# Patient Record
Sex: Female | Born: 1980 | Hispanic: No | Marital: Single | Smoking: Current every day smoker
Health system: Southern US, Community
[De-identification: ages and names within clinical notes are randomized; demographics above are authoritative.]

## PROBLEM LIST (undated history)

## (undated) DIAGNOSIS — R011 Cardiac murmur, unspecified: Secondary | ICD-10-CM

## (undated) DIAGNOSIS — F419 Anxiety disorder, unspecified: Secondary | ICD-10-CM

---
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
## 2004-08-14 ENCOUNTER — Emergency Department (HOSPITAL_COMMUNITY): Admission: EM | Admit: 2004-08-14 | Discharge: 2004-08-14 | Payer: Self-pay | Admitting: Emergency Medicine

## 2004-12-06 ENCOUNTER — Emergency Department (HOSPITAL_COMMUNITY): Admission: EM | Admit: 2004-12-06 | Discharge: 2004-12-06 | Payer: Self-pay | Admitting: *Deleted

## 2007-06-15 ENCOUNTER — Emergency Department (HOSPITAL_COMMUNITY): Admission: EM | Admit: 2007-06-15 | Discharge: 2007-06-15 | Payer: Self-pay | Admitting: Emergency Medicine

## 2007-09-16 ENCOUNTER — Emergency Department (HOSPITAL_COMMUNITY): Admission: EM | Admit: 2007-09-16 | Discharge: 2007-09-17 | Payer: Self-pay | Admitting: Emergency Medicine

## 2008-07-26 ENCOUNTER — Emergency Department (HOSPITAL_COMMUNITY): Admission: EM | Admit: 2008-07-26 | Discharge: 2008-07-26 | Payer: Self-pay | Admitting: Emergency Medicine

## 2008-10-06 ENCOUNTER — Emergency Department (HOSPITAL_COMMUNITY): Admission: EM | Admit: 2008-10-06 | Discharge: 2008-10-06 | Payer: Self-pay | Admitting: Emergency Medicine

## 2008-10-15 ENCOUNTER — Emergency Department (HOSPITAL_COMMUNITY): Admission: EM | Admit: 2008-10-15 | Discharge: 2008-10-15 | Payer: Self-pay | Admitting: Emergency Medicine

## 2008-11-25 ENCOUNTER — Inpatient Hospital Stay (HOSPITAL_COMMUNITY): Admission: AD | Admit: 2008-11-25 | Discharge: 2008-11-26 | Payer: Self-pay | Admitting: Obstetrics and Gynecology

## 2010-04-24 ENCOUNTER — Emergency Department (HOSPITAL_COMMUNITY)
Admission: EM | Admit: 2010-04-24 | Discharge: 2010-04-24 | Payer: Self-pay | Source: Home / Self Care | Admitting: Emergency Medicine

## 2010-07-02 LAB — WET PREP, GENITAL: Trich, Wet Prep: NONE SEEN

## 2010-07-02 LAB — URINALYSIS, ROUTINE W REFLEX MICROSCOPIC
Bilirubin Urine: NEGATIVE
Glucose, UA: NEGATIVE mg/dL
Ketones, ur: NEGATIVE mg/dL
Leukocytes, UA: NEGATIVE
Nitrite: NEGATIVE
Protein, ur: NEGATIVE mg/dL
Specific Gravity, Urine: 1.011 (ref 1.005–1.030)
Urobilinogen, UA: 0.2 mg/dL (ref 0.0–1.0)
pH: 5.5 (ref 5.0–8.0)

## 2010-07-02 LAB — GC/CHLAMYDIA PROBE AMP, GENITAL
Chlamydia, DNA Probe: NEGATIVE
GC Probe Amp, Genital: NEGATIVE

## 2010-07-02 LAB — URINE MICROSCOPIC-ADD ON

## 2010-07-02 LAB — POCT PREGNANCY, URINE: Preg Test, Ur: NEGATIVE

## 2010-07-24 ENCOUNTER — Emergency Department (HOSPITAL_COMMUNITY)
Admission: EM | Admit: 2010-07-24 | Discharge: 2010-07-24 | Disposition: A | Discharge status: Home / Self Care | Payer: Self-pay | Attending: Emergency Medicine | Admitting: Emergency Medicine

## 2010-07-24 DIAGNOSIS — K089 Disorder of teeth and supporting structures, unspecified: Secondary | ICD-10-CM | POA: Insufficient documentation

## 2010-07-24 DIAGNOSIS — K029 Dental caries, unspecified: Secondary | ICD-10-CM | POA: Insufficient documentation

## 2010-07-29 LAB — CBC
HCT: 41 % (ref 36.0–46.0)
Hemoglobin: 14.3 g/dL (ref 12.0–15.0)
MCHC: 34.9 g/dL (ref 30.0–36.0)
MCV: 84.7 fL (ref 78.0–100.0)
Platelets: 293 10*3/uL (ref 150–400)
RBC: 4.84 MIL/uL (ref 3.87–5.11)
RDW: 12.9 % (ref 11.5–15.5)
WBC: 11.6 10*3/uL — ABNORMAL HIGH (ref 4.0–10.5)

## 2010-07-29 LAB — URINALYSIS, ROUTINE W REFLEX MICROSCOPIC
Bilirubin Urine: NEGATIVE
Glucose, UA: NEGATIVE mg/dL
Ketones, ur: NEGATIVE mg/dL
Nitrite: NEGATIVE
Protein, ur: NEGATIVE mg/dL
Specific Gravity, Urine: 1.02 (ref 1.005–1.030)
Urobilinogen, UA: 0.2 mg/dL (ref 0.0–1.0)
pH: 6.5 (ref 5.0–8.0)

## 2010-07-29 LAB — URINE CULTURE: Colony Count: >=100000

## 2010-07-29 LAB — COMPREHENSIVE METABOLIC PANEL
ALT: 19 U/L (ref 0–35)
AST: 21 U/L (ref 0–37)
Albumin: 4 g/dL (ref 3.5–5.2)
Alkaline Phosphatase: 52 U/L (ref 39–117)
BUN: 8 mg/dL (ref 6–23)
CO2: 28 mEq/L (ref 19–32)
Calcium: 9.8 mg/dL (ref 8.4–10.5)
Chloride: 104 mEq/L (ref 96–112)
Creatinine, Ser: 0.63 mg/dL (ref 0.4–1.2)
GFR calc Af Amer: >60 mL/min (ref >60)
GFR calc non Af Amer: >60 mL/min (ref >60)
Glucose, Bld: 94 mg/dL (ref 70–99)
Potassium: 4.1 mEq/L (ref 3.5–5.1)
Sodium: 139 mEq/L (ref 135–145)
Total Bilirubin: 0.3 mg/dL (ref 0.3–1.2)
Total Protein: 7.8 g/dL (ref 6.0–8.3)

## 2010-07-29 LAB — POCT PREGNANCY, URINE: Preg Test, Ur: NEGATIVE

## 2010-07-29 LAB — URINE MICROSCOPIC-ADD ON

## 2010-07-29 LAB — WET PREP, GENITAL
Trich, Wet Prep: NONE SEEN
Yeast Wet Prep HPF POC: NONE SEEN

## 2010-07-29 LAB — GC/CHLAMYDIA PROBE AMP, GENITAL
Chlamydia, DNA Probe: NEGATIVE
GC Probe Amp, Genital: NEGATIVE

## 2010-10-05 ENCOUNTER — Emergency Department (HOSPITAL_COMMUNITY): Payer: Self-pay

## 2010-10-05 ENCOUNTER — Emergency Department (HOSPITAL_COMMUNITY)
Admission: EM | Admit: 2010-10-05 | Discharge: 2010-10-05 | Disposition: A | Discharge status: Home / Self Care | Payer: Self-pay | Attending: Emergency Medicine | Admitting: Emergency Medicine

## 2010-10-05 DIAGNOSIS — W010XXA Fall on same level from slipping, tripping and stumbling without subsequent striking against object, initial encounter: Secondary | ICD-10-CM | POA: Insufficient documentation

## 2010-10-05 DIAGNOSIS — M25569 Pain in unspecified knee: Secondary | ICD-10-CM | POA: Insufficient documentation

## 2011-01-11 LAB — URINE MICROSCOPIC-ADD ON

## 2011-01-11 LAB — URINALYSIS, ROUTINE W REFLEX MICROSCOPIC
Bilirubin Urine: NEGATIVE
Glucose, UA: NEGATIVE
Ketones, ur: NEGATIVE
Nitrite: NEGATIVE
Protein, ur: NEGATIVE
Specific Gravity, Urine: 1.013
Urobilinogen, UA: 0.2
pH: 5.5

## 2011-01-11 LAB — PREGNANCY, URINE: Preg Test, Ur: NEGATIVE

## 2011-01-16 LAB — POCT PREGNANCY, URINE
Operator id: 22937
Preg Test, Ur: NEGATIVE

## 2011-06-22 ENCOUNTER — Emergency Department (HOSPITAL_COMMUNITY): Payer: Self-pay

## 2011-06-22 ENCOUNTER — Encounter (HOSPITAL_COMMUNITY): Payer: Self-pay | Admitting: *Deleted

## 2011-06-22 ENCOUNTER — Emergency Department (HOSPITAL_COMMUNITY)
Admission: EM | Admit: 2011-06-22 | Discharge: 2011-06-22 | Disposition: A | Discharge status: Home / Self Care | Payer: Self-pay | Attending: Emergency Medicine | Admitting: Emergency Medicine

## 2011-06-22 DIAGNOSIS — R10819 Abdominal tenderness, unspecified site: Secondary | ICD-10-CM | POA: Insufficient documentation

## 2011-06-22 DIAGNOSIS — N760 Acute vaginitis: Secondary | ICD-10-CM | POA: Insufficient documentation

## 2011-06-22 DIAGNOSIS — E010 Iodine-deficiency related diffuse (endemic) goiter: Secondary | ICD-10-CM

## 2011-06-22 DIAGNOSIS — R112 Nausea with vomiting, unspecified: Secondary | ICD-10-CM | POA: Insufficient documentation

## 2011-06-22 DIAGNOSIS — A499 Bacterial infection, unspecified: Secondary | ICD-10-CM | POA: Insufficient documentation

## 2011-06-22 DIAGNOSIS — F17200 Nicotine dependence, unspecified, uncomplicated: Secondary | ICD-10-CM | POA: Insufficient documentation

## 2011-06-22 DIAGNOSIS — R070 Pain in throat: Secondary | ICD-10-CM | POA: Insufficient documentation

## 2011-06-22 DIAGNOSIS — R300 Dysuria: Secondary | ICD-10-CM | POA: Insufficient documentation

## 2011-06-22 DIAGNOSIS — B9689 Other specified bacterial agents as the cause of diseases classified elsewhere: Secondary | ICD-10-CM

## 2011-06-22 DIAGNOSIS — R102 Pelvic and perineal pain: Secondary | ICD-10-CM

## 2011-06-22 DIAGNOSIS — E049 Nontoxic goiter, unspecified: Secondary | ICD-10-CM | POA: Insufficient documentation

## 2011-06-22 DIAGNOSIS — M542 Cervicalgia: Secondary | ICD-10-CM | POA: Insufficient documentation

## 2011-06-22 DIAGNOSIS — R059 Cough, unspecified: Secondary | ICD-10-CM

## 2011-06-22 DIAGNOSIS — R109 Unspecified abdominal pain: Secondary | ICD-10-CM | POA: Insufficient documentation

## 2011-06-22 DIAGNOSIS — N949 Unspecified condition associated with female genital organs and menstrual cycle: Secondary | ICD-10-CM | POA: Insufficient documentation

## 2011-06-22 DIAGNOSIS — M549 Dorsalgia, unspecified: Secondary | ICD-10-CM | POA: Insufficient documentation

## 2011-06-22 DIAGNOSIS — R05 Cough: Secondary | ICD-10-CM

## 2011-06-22 LAB — WET PREP, GENITAL
Trich, Wet Prep: NONE SEEN
Yeast Wet Prep HPF POC: NONE SEEN

## 2011-06-22 LAB — URINALYSIS, ROUTINE W REFLEX MICROSCOPIC
Bilirubin Urine: NEGATIVE
Glucose, UA: NEGATIVE mg/dL
Ketones, ur: NEGATIVE mg/dL
Leukocytes, UA: NEGATIVE
Nitrite: NEGATIVE
Protein, ur: NEGATIVE mg/dL
Specific Gravity, Urine: 1.015 (ref 1.005–1.030)
Urobilinogen, UA: 0.2 mg/dL (ref 0.0–1.0)
pH: 6 (ref 5.0–8.0)

## 2011-06-22 LAB — URINE MICROSCOPIC-ADD ON

## 2011-06-22 LAB — PREGNANCY, URINE: Preg Test, Ur: NEGATIVE

## 2011-06-22 MED ORDER — HYDROMORPHONE HCL PF 1 MG/ML IJ SOLN
1.0000 mg | Freq: Once | INTRAMUSCULAR | Status: AC
  Administered 2011-06-22: 1 mg via INTRAMUSCULAR
  Filled 2011-06-22: qty 1

## 2011-06-22 MED ORDER — PROMETHAZINE HCL 25 MG PO TABS: 25.0000 mg | ORAL_TABLET | Freq: Four times a day (QID) | ORAL | Status: DC | PRN

## 2011-06-22 MED ORDER — HYDROCODONE-ACETAMINOPHEN 5-325 MG PO TABS
2.0000 | ORAL_TABLET | Freq: Once | ORAL | Status: AC
  Administered 2011-06-22: 2 via ORAL
  Filled 2011-06-22: qty 2

## 2011-06-22 MED ORDER — ONDANSETRON 8 MG PO TBDP
8.0000 mg | ORAL_TABLET | Freq: Once | ORAL | Status: AC
  Administered 2011-06-22: 8 mg via ORAL
  Filled 2011-06-22: qty 1

## 2011-06-22 MED ORDER — HYDROCODONE-ACETAMINOPHEN 5-325 MG PO TABS: ORAL_TABLET | ORAL | Status: DC

## 2011-06-22 MED ORDER — GI COCKTAIL ~~LOC~~
30.0000 mL | Freq: Once | ORAL | Status: AC
  Administered 2011-06-22: 30 mL via ORAL
  Filled 2011-06-22: qty 30

## 2011-06-22 MED ORDER — METRONIDAZOLE 500 MG PO TABS: 500.0000 mg | ORAL_TABLET | Freq: Two times a day (BID) | ORAL | Status: AC

## 2011-06-22 NOTE — Discharge Instructions (Signed)
Bacterial Vaginosis Bacterial vaginosis (BV) is a vaginal infection where the normal balance of bacteria in the vagina is disrupted. The normal balance is then replaced by an overgrowth of certain bacteria. There are several different kinds of bacteria that can cause BV. BV is the most common vaginal infection in women of childbearing age. CAUSES   The cause of BV is not fully understood. BV develops when there is an increase or imbalance of harmful bacteria.   Some activities or behaviors can upset the normal balance of bacteria in the vagina and put women at increased risk including:   Having a new sex partner or multiple sex partners.   Douching.   Using an intrauterine device (IUD) for contraception.   It is not clear what role sexual activity plays in the development of BV. However, women that have never had sexual intercourse are rarely infected with BV.  Women do not get BV from toilet seats, bedding, swimming pools or from touching objects around them.  SYMPTOMS   Grey vaginal discharge.   A fish-like odor with discharge, especially after sexual intercourse.   Itching or burning of the vagina and vulva.   Burning or pain with urination.   Some women have no signs or symptoms at all.  DIAGNOSIS  Your caregiver must examine the vagina for signs of BV. Your caregiver will perform lab tests and look at the sample of vaginal fluid through a microscope. They will look for bacteria and abnormal cells (clue cells), a pH test higher than 4.5, and a positive amine test all associated with BV.  RISKS AND COMPLICATIONS   Pelvic inflammatory disease (PID).   Infections following gynecology surgery.   Developing HIV.   Developing herpes virus.  TREATMENT  Sometimes BV will clear up without treatment. However, all women with symptoms of BV should be treated to avoid complications, especially if gynecology surgery is planned. Female partners generally do not need to be treated. However,  BV may spread between female sex partners so treatment is helpful in preventing a recurrence of BV.   BV may be treated with antibiotics. The antibiotics come in either pill or vaginal cream forms. Either can be used with nonpregnant or pregnant women, but the recommended dosages differ. These antibiotics are not harmful to the baby.   BV can recur after treatment. If this happens, a second round of antibiotics will often be prescribed.   Treatment is important for pregnant women. If not treated, BV can cause a premature delivery, especially for a pregnant woman who had a premature birth in the past. All pregnant women who have symptoms of BV should be checked and treated.   For chronic reoccurrence of BV, treatment with a type of prescribed gel vaginally twice a week is helpful.  HOME CARE INSTRUCTIONS   Finish all medication as directed by your caregiver.   Do not have sex until treatment is completed.   Tell your sexual partner that you have a vaginal infection. They should see their caregiver and be treated if they have problems, such as a mild rash or itching.   Practice safe sex. Use condoms. Only have 1 sex partner.  PREVENTION  Basic prevention steps can help reduce the risk of upsetting the natural balance of bacteria in the vagina and developing BV:  Do not have sexual intercourse (be abstinent).   Do not douche.   Use all of the medicine prescribed for treatment of BV, even if the signs and symptoms go away.     Tell your sex partner if you have BV. That way, they can be treated, if needed, to prevent reoccurrence.  SEEK MEDICAL CARE IF:   Your symptoms are not improving after 3 days of treatment.   You have increased discharge, pain, or fever.  MAKE SURE YOU:   Understand these instructions.   Will watch your condition.   Will get help right away if you are not doing well or get worse.  FOR MORE INFORMATION  Division of STD Prevention (DSTDP), Centers for Disease  Control and Prevention: [employer]Apps[employer] American Social Health Association (ASHA): www.ashastd.org  Document Released: 04/08/2005 Document Revised: 12/19/2010 Document Reviewed: 09/29/2008 [facility] Patient Information 2012 [place], [place].    Nausea and Vomiting Nausea is a sick feeling that often comes before throwing up (vomiting). Vomiting is a reflex where stomach contents come out of your mouth. Vomiting can cause severe loss of body fluids (dehydration). Children and elderly adults can become dehydrated quickly, especially if they also have diarrhea. Nausea and vomiting are symptoms of a condition or disease. It is important to find the cause of your symptoms. CAUSES   Direct irritation of the stomach lining. This irritation can result from increased acid production (gastroesophageal reflux disease), infection, food poisoning, taking certain medicines (such as nonsteroidal anti-inflammatory drugs), alcohol use, or tobacco use.   Signals from the brain.These signals could be caused by a headache, heat exposure, an inner ear disturbance, increased pressure in the brain from injury, infection, a tumor, or a concussion, pain, emotional stimulus, or metabolic problems.   An obstruction in the gastrointestinal tract (bowel obstruction).   Illnesses such as diabetes, hepatitis, gallbladder problems, appendicitis, kidney problems, cancer, sepsis, atypical symptoms of a heart attack, or eating disorders.   Medical treatments such as chemotherapy and radiation.   Receiving medicine that makes you sleep (general anesthetic) during surgery.  DIAGNOSIS Your caregiver may ask for tests to be done if the problems do not improve after a few days. Tests may also be done if symptoms are severe or if the reason for the nausea and vomiting is not clear. Tests may include:  Urine tests.   Blood tests.   Stool tests.   Cultures (to look for evidence of infection).   X-rays or other imaging studies.   Test results can help your caregiver make decisions about treatment or the need for additional tests. TREATMENT You need to stay well hydrated. Drink frequently but in small amounts.You may wish to drink water, sports drinks, clear broth, or eat frozen ice pops or gelatin dessert to help stay hydrated.When you eat, eating slowly may help prevent nausea.There are also some antinausea medicines that may help prevent nausea. HOME CARE INSTRUCTIONS   Take all medicine as directed by your caregiver.   If you do not have an appetite, do not force yourself to eat. However, you must continue to drink fluids.   If you have an appetite, eat a normal diet unless your caregiver tells you differently.   Eat a variety of complex carbohydrates (rice, wheat, potatoes, bread), lean meats, yogurt, fruits, and vegetables.   Avoid high-fat foods because they are more difficult to digest.   Drink enough water and fluids to keep your urine clear or pale yellow.   If you are dehydrated, ask your caregiver for specific rehydration instructions. Signs of dehydration may include:   Severe thirst.   Dry lips and mouth.   Dizziness.   Dark urine.   Decreasing urine frequency and amount.  Confusion.   Rapid breathing or pulse.  SEEK IMMEDIATE MEDICAL CARE IF:   You have blood or brown flecks (like coffee grounds) in your vomit.   You have black or bloody stools.   You have a severe headache or stiff neck.   You are confused.   You have severe abdominal pain.   You have chest pain or trouble breathing.   You do not urinate at least once every 8 hours.   You develop cold or clammy skin.   You continue to vomit for longer than 24 to 48 hours.   You have a fever.  MAKE SURE YOU:   Understand these instructions.   Will watch your condition.   Will get help right away if you are not doing well or get worse.  Document Released: 04/08/2005 Document Revised: 12/19/2010 Document Reviewed:  09/05/2010 [facility] Patient Information 2012 [place], [place].     I recommend that you start seeing a primary care provider such as [facility] or county health department.  I think you may benefit from ultrasound of your thyroid and also routine PAP smears each year.  Norco is a narcotic which can cause addiction, drowsiness or slowed breathing so use with caution.  Do not drive while taking it.  Do not drink alcohol while taking metronidazole (Flagyl) antibiotic.

## 2011-06-22 NOTE — ED Provider Notes (Signed)
History     CSN: [id number]  Arrival date & time 06/22/11  1649   First MD Initiated Contact with Patient 06/22/11 1834      Chief Complaint  Patient presents with  . Abdominal Pain    (Consider location/radiation/quality/duration/timing/severity/associated sxs/prior treatment) HPI Comments: Patient reports onset of lower abdominal discomfort about 2 weeks ago. She reports some associated dysuria and radiation to her back more so on the right side. She reports the lower abdominal pain is somewhat worse in the right side but certainly feels of the left side and also in the suprapubic region. She notes a thick white discharge that has a foul odor. She reports she is sexually active, does not use condoms or birth control. Her last menstrual cycle was about 1-1/2 weeks ago and was normal. She denies any history of STD exposure, history of STDs or vaginal infections in the past. She has had urinary tract infection the past. She reports in the last several days she has had nausea and vomiting primarily when she tries to use her drink. She subsequently now also has a dry cough as well as pain in her upper epigastric region which she agrees may be attributed to vomiting. She denies fever or chills. She also notes in the last 5 days she's had some pain in her throat with swallowing and has noticed some lumps that she can palpate on the left side of her neck which are mildly tender. She denies a stiff neck, rash. She denies shortness of breath or wheezing. She reports that she does smoke cigarettes. She reports that she does not have insurance and does not have a primary care physician is why she has not followed up with anyone until today. She denies any diarrhea, constipation or black or bloody stools. She denies weight loss, night sweats, palpitations, loss of hair.    Patient is a 32 y.o. female presenting with abdominal pain. The history is provided by the patient and a relative.  Abdominal Pain The  primary symptoms of the illness include abdominal pain, nausea, vomiting and vaginal discharge. The primary symptoms of the illness do not include fever, shortness of breath or vaginal bleeding.  Additional symptoms associated with the illness include back pain. Symptoms associated with the illness do not include chills.    History reviewed. No pertinent past medical history.  History reviewed. No pertinent past surgical history.  History reviewed. No pertinent family history.  History  Substance Use Topics  . Smoking status: Current Everyday Smoker  . Smokeless tobacco: Not on file  . Alcohol Use: No    OB History    Grav Para Term Preterm Abortions TAB SAB Ect Mult Living                  Review of Systems  Constitutional: Positive for appetite change. Negative for fever and chills.  HENT: Negative for congestion, rhinorrhea, voice change and sinus pressure.   Respiratory: Positive for cough. Negative for shortness of breath.   Gastrointestinal: Positive for nausea, vomiting and abdominal pain.  Genitourinary: Positive for flank pain and vaginal discharge. Negative for vaginal bleeding.  Musculoskeletal: Positive for back pain.  Skin: Negative for rash and wound.  Hematological: Positive for adenopathy.  All other systems reviewed and are negative.    Allergies  Ibuprofen; Penicillins; and Tramadol  Home Medications  No current outpatient prescriptions on file.  BP 100/64  Pulse 76  Temp(Src) 97.4 F (36.3 C) (Oral)  Resp 20  Ht  5\' 2"  (1.575 m)  Wt 160 lb (72.576 kg)  BMI 29.26 kg/m2  SpO2 95%  LMP 06/13/2011  Physical Exam  Nursing note and vitals reviewed. Constitutional: She is oriented to person, place, and time. Vital signs are normal. She appears well-developed and well-nourished. No distress.       hirsutism  HENT:  Head: Normocephalic and atraumatic.  Eyes: Conjunctivae and EOM are normal. Pupils are equal, round, and reactive to light. No scleral  icterus.  Neck: Trachea normal and phonation normal. Neck supple. No JVD present. No tracheal deviation present. Thyromegaly present.  Cardiovascular: Normal rate.   Pulmonary/Chest: Effort normal. No accessory muscle usage or stridor. Not tachypneic. No respiratory distress. She has no decreased breath sounds. She has no wheezes. She has no rhonchi.  Abdominal: Soft. She exhibits no distension and no mass. There is tenderness. There is guarding. There is no rebound.  Genitourinary: Cervix exhibits no motion tenderness and no discharge. Right adnexum displays tenderness. Right adnexum displays no mass. Left adnexum displays no mass. No erythema or bleeding around the vagina. Vaginal discharge found.       Chaperone present  Musculoskeletal: She exhibits no edema.  Neurological: She is alert and oriented to person, place, and time.  Skin: Skin is warm, dry and intact. No rash noted. She is not diaphoretic.  Psychiatric: She has a normal mood and affect.    ED Course  Procedures (including critical care time)  Labs Reviewed  URINALYSIS, ROUTINE W REFLEX MICROSCOPIC - Abnormal; Notable for the following:    Hgb urine dipstick SMALL (*)    All other components within normal limits  URINE MICROSCOPIC-ADD ON - Abnormal; Notable for the following:    Squamous Epithelial / LPF MANY (*)    Bacteria, UA MANY (*)    All other components within normal limits  WET PREP, GENITAL - Abnormal; Notable for the following:    Clue Cells Wet Prep HPF POC MANY (*)    WBC, Wet Prep HPF POC FEW (*)    All other components within normal limits  PREGNANCY, URINE  GC/CHLAMYDIA PROBE AMP, GENITAL   Dg Chest 2 View  06/22/2011  *RADIOLOGY REPORT*  Clinical Data: Abdominal pain and cough  CHEST - 2 VIEW  Comparison: None  Findings: The heart size and mediastinal contours are within normal limits.  Both lungs are clear.  The visualized skeletal structures are unremarkable.  IMPRESSION: No active cardiopulmonary  abnormalities.  Original Report Authenticated By: Rosealee Albee, M.D.   I reviewed above CXR myself  No diagnosis found.    MDM   Patient has normal vital signs and does not appear to be significantly in any distress. She is nontoxic in appearance. Her urinalysis does not reveal any evidence of urinary tract infection except for the many bacteria, however she also has many squamous epithelial which suggests a unclean sample. However there is no leukocyte esterase or nitrite positivity. Plan is to perform a pelvic examination to assess for possible vaginal or uterine infection. Given she has tenderness on both sides of her abdomen, do not fever biliary attack nor appendicitis. Her abdomen is otherwise soft and although she has some mild guarding, she does not have distention or rebound tenderness. The small lumps on the left side of her neck is in the area of the left side of her thyroid. Patient is counseled to obtain a primary care physician for possible ultrasound of her thyroid as an outpatient. Her review of systems does  not reveal any symptoms that seem to be too concerning for hypo-or hyperthyroidism. She reports that she's had normal menses, no weight changes no palpitations.  IM analgesics and antiemetics are provided.        Pt was signed out to Dr. Estell Harpin for follow up of U/S to r/o torsion or abscess.  Gavin Pound. Oletta Lamas, MD 06/23/11 2141

## 2011-06-22 NOTE — ED Notes (Signed)
Pt states abdominal pain x 2 weeks, states white vaginal discharge with foul odor. Also states lower back pain. Also "knots to her neck" and burning sensation to neck.

## 2011-06-23 NOTE — ED Provider Notes (Signed)
Results for orders placed during the hospital encounter of 06/22/11  URINALYSIS, ROUTINE W REFLEX MICROSCOPIC      Component Value Range   Color, Urine YELLOW  YELLOW    APPearance CLEAR  CLEAR    Specific Gravity, Urine 1.015  1.005 - 1.030    pH 6.0  5.0 - 8.0    Glucose, UA NEGATIVE  NEGATIVE (mg/dL)   Hgb urine dipstick SMALL (*) NEGATIVE    Bilirubin Urine NEGATIVE  NEGATIVE    Ketones, ur NEGATIVE  NEGATIVE (mg/dL)   Protein, ur NEGATIVE  NEGATIVE (mg/dL)   Urobilinogen, UA 0.2  0.0 - 1.0 (mg/dL)   Nitrite NEGATIVE  NEGATIVE    Leukocytes, UA NEGATIVE  NEGATIVE   PREGNANCY, URINE      Component Value Range   Preg Test, Ur NEGATIVE  NEGATIVE   URINE MICROSCOPIC-ADD ON      Component Value Range   Squamous Epithelial / LPF MANY (*) RARE    WBC, UA 7-10  <3 (WBC/hpf)   RBC / HPF 3-6  <3 (RBC/hpf)   Bacteria, UA MANY (*) RARE   WET PREP, GENITAL      Component Value Range   Yeast Wet Prep HPF POC NONE SEEN  NONE SEEN    Trich, Wet Prep NONE SEEN  NONE SEEN    Clue Cells Wet Prep HPF POC MANY (*) NONE SEEN    WBC, Wet Prep HPF POC FEW (*) NONE SEEN    Dg Chest 2 View  06/22/2011  *RADIOLOGY REPORT*  Clinical Data: Abdominal pain and cough  CHEST - 2 VIEW  Comparison: None  Findings: The heart size and mediastinal contours are within normal limits.  Both lungs are clear.  The visualized skeletal structures are unremarkable.  IMPRESSION: No active cardiopulmonary abnormalities.  Original Report Authenticated By: Rosealee Albee, M.D.   US Transvaginal Non-ob  06/22/2011  *RADIOLOGY REPORT*  Clinical Data:  Right-sided pelvic pain and vomiting; assess for ovarian torsion.  TRANSABDOMINAL AND TRANSVAGINAL ULTRASOUND OF PELVIS DOPPLER ULTRASOUND OF OVARIES  Technique:  Both transabdominal and transvaginal ultrasound examinations of the pelvis were performed. Transabdominal technique was performed for global imaging of the pelvis including uterus, ovaries, adnexal regions, and pelvic  cul-de-sac.  It was necessary to proceed with endovaginal exam following the transabdominal exam to visualize the uterus and ovaries in greater detail.  Color and duplex Doppler ultrasound was utilized to evaluate blood flow to the ovaries.  Comparison:  Pelvic ultrasound performed 11/25/2008  Findings:  Uterus:  Normal in size and appearance; measures 6.6 x 2.9 x 4.2 cm.  Endometrium:  Normal in thickness and appearance; measures 0.4 cm in thickness.  Right ovary: Normal appearance/no adnexal mass; measures 4.0 x 2.1 x 2.3 cm.  Left ovary:   Normal appearance/no adnexal mass; measures 3.1 x 2.3 x 2.3 cm.  Pulsed Doppler evaluation demonstrates normal low-resistance arterial and venous waveforms in both ovaries.  IMPRESSION: Normal exam.  No evidence of pelvic mass or other significant abnormality.  No sonographic evidence for ovarian torsion.  Original Report Authenticated By: Tonia Ghent, M.D.   US Pelvis Complete  06/22/2011  *RADIOLOGY REPORT*  Clinical Data:  Right-sided pelvic pain and vomiting; assess for ovarian torsion.  TRANSABDOMINAL AND TRANSVAGINAL ULTRASOUND OF PELVIS DOPPLER ULTRASOUND OF OVARIES  Technique:  Both transabdominal and transvaginal ultrasound examinations of the pelvis were performed. Transabdominal technique was performed for global imaging of the pelvis including uterus, ovaries, adnexal regions, and pelvic cul-de-sac.  It  was necessary to proceed with endovaginal exam following the transabdominal exam to visualize the uterus and ovaries in greater detail.  Color and duplex Doppler ultrasound was utilized to evaluate blood flow to the ovaries.  Comparison:  Pelvic ultrasound performed 11/25/2008  Findings:  Uterus:  Normal in size and appearance; measures 6.6 x 2.9 x 4.2 cm.  Endometrium:  Normal in thickness and appearance; measures 0.4 cm in thickness.  Right ovary: Normal appearance/no adnexal mass; measures 4.0 x 2.1 x 2.3 cm.  Left ovary:   Normal appearance/no adnexal mass;  measures 3.1 x 2.3 x 2.3 cm.  Pulsed Doppler evaluation demonstrates normal low-resistance arterial and venous waveforms in both ovaries.  IMPRESSION: Normal exam.  No evidence of pelvic mass or other significant abnormality.  No sonographic evidence for ovarian torsion.  Original Report Authenticated By: Tonia Ghent, M.D.   [place] Art/ven Flow Abd Pelv Doppler  06/22/2011  *RADIOLOGY REPORT*  Clinical Data:  Right-sided pelvic pain and vomiting; assess for ovarian torsion.  TRANSABDOMINAL AND TRANSVAGINAL ULTRASOUND OF PELVIS DOPPLER ULTRASOUND OF OVARIES  Technique:  Both transabdominal and transvaginal ultrasound examinations of the pelvis were performed. Transabdominal technique was performed for global imaging of the pelvis including uterus, ovaries, adnexal regions, and pelvic cul-de-sac.  It was necessary to proceed with endovaginal exam following the transabdominal exam to visualize the uterus and ovaries in greater detail.  Color and duplex Doppler ultrasound was utilized to evaluate blood flow to the ovaries.  Comparison:  Pelvic ultrasound performed 11/25/2008  Findings:  Uterus:  Normal in size and appearance; measures 6.6 x 2.9 x 4.2 cm.  Endometrium:  Normal in thickness and appearance; measures 0.4 cm in thickness.  Right ovary: Normal appearance/no adnexal mass; measures 4.0 x 2.1 x 2.3 cm.  Left ovary:   Normal appearance/no adnexal mass; measures 3.1 x 2.3 x 2.3 cm.  Pulsed Doppler evaluation demonstrates normal low-resistance arterial and venous waveforms in both ovaries.  IMPRESSION: Normal exam.  No evidence of pelvic mass or other significant abnormality.  No sonographic evidence for ovarian torsion.  Original Report Authenticated By: Tonia Ghent, M.D.   Pt told of [place] results and discharged home   Benny Lennert, MD 06/23/11 1149

## 2011-06-24 LAB — GC/CHLAMYDIA PROBE AMP, GENITAL
Chlamydia, DNA Probe: NEGATIVE
GC Probe Amp, Genital: NEGATIVE

## 2011-07-22 ENCOUNTER — Encounter (HOSPITAL_COMMUNITY): Payer: Self-pay | Admitting: Emergency Medicine

## 2011-07-22 ENCOUNTER — Emergency Department (HOSPITAL_COMMUNITY)
Admission: EM | Admit: 2011-07-22 | Discharge: 2011-07-22 | Disposition: A | Discharge status: Home / Self Care | Payer: Self-pay | Attending: Emergency Medicine | Admitting: Emergency Medicine

## 2011-07-22 DIAGNOSIS — M549 Dorsalgia, unspecified: Secondary | ICD-10-CM

## 2011-07-22 DIAGNOSIS — R262 Difficulty in walking, not elsewhere classified: Secondary | ICD-10-CM | POA: Insufficient documentation

## 2011-07-22 MED ORDER — DIAZEPAM 5 MG PO TABS: 5.0000 mg | ORAL_TABLET | Freq: Two times a day (BID) | ORAL | Status: AC

## 2011-07-22 MED ORDER — OXYCODONE-ACETAMINOPHEN 5-325 MG PO TABS
2.0000 | ORAL_TABLET | Freq: Once | ORAL | Status: AC
  Administered 2011-07-22: 2 via ORAL
  Filled 2011-07-22: qty 2

## 2011-07-22 MED ORDER — HYDROCODONE-ACETAMINOPHEN 5-325 MG PO TABS: 2.0000 | ORAL_TABLET | ORAL | Status: AC | PRN

## 2011-07-22 MED ORDER — DIAZEPAM 5 MG/ML IJ SOLN
10.0000 mg | Freq: Once | INTRAMUSCULAR | Status: AC
  Administered 2011-07-22: 10 mg via INTRAVENOUS
  Filled 2011-07-22: qty 2

## 2011-07-22 NOTE — ED Notes (Signed)
Pt states two days ago she was carrying a bag of copper that weighed about 60 lbs since then she has had pain in her lower back  Pt states it does not matter what position she is in it hurts   Pt states pain radiates down her right leg

## 2011-07-22 NOTE — ED Provider Notes (Signed)
History     CSN: [id number]  Arrival date & time 07/22/11  1831   First MD Initiated Contact with Patient 07/22/11 2128      Chief Complaint  Patient presents with  . Back Pain    (Consider location/radiation/quality/duration/timing/severity/associated sxs/prior treatment) HPI Comments: Patient reports that two days ago she was lifting a large bag of copper and has been having lower back pain since that time.  Pain worse on the right side.  She has not taken anything for the pain.  No prior injury.   Patient is a 31 y.o. female presenting with back pain. The history is provided by the patient.  Back Pain  This is a new problem. The current episode started 2 days ago. The problem occurs constantly. The problem has not changed since onset.The pain is associated with lifting heavy objects. The pain is present in the lumbar spine. The quality of the pain is described as shooting. The pain radiates to the right thigh. The pain is moderate. The symptoms are aggravated by bending and twisting. Pertinent negatives include no fever, no numbness, no bowel incontinence, no perianal numbness, no dysuria, no paresthesias, no paresis, no tingling and no weakness. She has tried nothing for the symptoms.    History reviewed. No pertinent past medical history.  History reviewed. No pertinent past surgical history.  Family History  Problem Relation Age of Onset  . Hypertension Mother   . Heart failure Mother   . Heart failure Father     History  Substance Use Topics  . Smoking status: Current Everyday Smoker -- 0.5 packs/day    Types: Cigarettes  . Smokeless tobacco: Not on file  . Alcohol Use: No    OB History    Grav Para Term Preterm Abortions TAB SAB Ect Mult Living                  Review of Systems  Constitutional: Negative for fever.  Gastrointestinal: Negative for bowel incontinence.  Genitourinary: Negative for dysuria.  Musculoskeletal: Positive for back pain.    Neurological: Negative for tingling, weakness, numbness and paresthesias.    Allergies  Aspirin; Ibuprofen; Penicillins; and Tramadol  Home Medications   Current Outpatient Rx  Name Route Sig Dispense Refill  . HYDROCODONE-ACETAMINOPHEN 5-325 MG PO TABS  1-2 tablets po q 6 hours prn moderate to severe pain 20 tablet 0  . PROMETHAZINE HCL 25 MG PO TABS Oral Take 1 tablet (25 mg total) by mouth every 6 (six) hours as needed for nausea. 15 tablet 0    BP 131/71  Pulse 93  Temp(Src) 98 F (36.7 C) (Oral)  Resp 18  Wt 160 lb (72.576 kg)  SpO2 100%  LMP 07/14/2011  Physical Exam  Nursing note and vitals reviewed. Constitutional: She is oriented to person, place, and time. She appears well-developed and well-nourished. No distress.  HENT:  Head: Normocephalic and atraumatic.  Eyes: Conjunctivae and EOM are normal. No scleral icterus.  Neck: Normal range of motion and full passive range of motion without pain. Neck supple. No spinous process tenderness and no muscular tenderness present. No rigidity. Normal range of motion present. No Brudzinski's sign noted.  Cardiovascular: Normal rate, regular rhythm and intact distal pulses.   Pulmonary/Chest: Effort normal and breath sounds normal. No respiratory distress. She has no wheezes. She has no rales. She exhibits no tenderness.  Musculoskeletal:       Cervical back: She exhibits normal range of motion, no tenderness, no bony tenderness  and no pain.       Thoracic back: She exhibits no tenderness, no bony tenderness and no pain.       Lumbar back: She exhibits tenderness, bony tenderness and pain. She exhibits no spasm and normal pulse.       Bilateral lower extremities nontender without color change, baseline range of motion of extremities.  Pt has increased pain w ROM of lumbar spine. Pain w ambulation, no sign of ataxia.  Neurological: She is alert and oriented to person, place, and time. She has normal strength and normal reflexes. No  sensory deficit. Gait (no ataxia, slowed and hunched d/t pain ) abnormal.       sensation at baseline for light touch in all 4 distal extremities, motor symmetric & bilateral 5/5  Abduction,adduction,flexion of hips, knee flexion & extension, foot dorsiflexion, foot plantar flexion.  Skin: Skin is warm and dry. No rash noted. She is not diaphoretic. No erythema. No pallor.  Psychiatric: She has a normal mood and affect.    ED Course  Procedures (including critical care time)  Labs Reviewed - No data to display No results found.   1. Back pain       MDM  Patient with back pain.  No neurological deficits and normal neuro exam.  Patient can walk but states is painful.  No loss of bowel or bladder control.  No concern for cauda equina.  No fever, night sweats, weight loss, h/o cancer, IVDU.  RICE protocol and pain medicine indicated and discussed with patient. Feel that back pain is most likely secondary to muscle strain from lifting heavy objects.  Patient given Rx for muscle relaxer and instructed to return if pain changes or worsens.        Pascal Lux [place], PA-C 07/23/11 [phone]

## 2011-07-22 NOTE — Discharge Instructions (Signed)
Use conservative methods at home including heat therapy and cold therapy as we discussed. More information on cold therapy is listed below.  It is not reccommended to use heat treatment directly after an acute injury.  SEEK IMMEDIATE MEDICAL ATTENTION IF: New numbness, tingling, weakness, or problem with the use of your arms or legs.  Severe back pain not relieved with medications.  Change in bowel or bladder control.  Increasing pain in any areas of the body (such as chest or abdominal pain).  Shortness of breath, dizziness or fainting.  Nausea (feeling sick to your stomach), vomiting, fever, or sweats.  COLD THERAPY DIRECTIONS:  Ice or gel packs can be used to reduce both pain and swelling. Ice is the most helpful within the first 24 to 48 hours after an injury or flareup from overusing a muscle or joint.  Ice is effective, has very few side effects, and is safe for most people to use.   If you expose your skin to cold temperatures for too long or without the proper protection, you can damage your skin or nerves. Watch for signs of skin damage due to cold.   HOME CARE INSTRUCTIONS  Follow these tips to use ice and cold packs safely.  Place a dry or damp towel between the ice and skin. A damp towel will cool the skin more quickly, so you may need to shorten the time that the ice is used.  For a more rapid response, add gentle compression to the ice.  Ice for no more than 10 to 20 minutes at a time. The bonier the area you are icing, the less time it will take to get the benefits of ice.  Check your skin after 5 minutes to make sure there are no signs of a poor response to cold or skin damage.  Rest 20 minutes or more in between uses.  Once your skin is numb, you can end your treatment. You can test numbness by very lightly touching your skin. The touch should be so light that you do not see the skin dimple from the pressure of your fingertip. When using ice, most people will feel these normal  sensations in this order: cold, burning, aching, and numbness.  Do not use ice on someone who cannot communicate their responses to pain, such as small children or people with dementia.   HOW TO MAKE AN ICE PACK  To make an ice pack, do one of the following:  Place crushed ice or a bag of frozen vegetables in a sealable plastic bag. Squeeze out the excess air. Place this bag inside another plastic bag. Slide the bag into a pillowcase or place a damp towel between your skin and the bag.  Mix 3 parts water with 1 part rubbing alcohol. Freeze the mixture in a sealable plastic bag. When you remove the mixture from the freezer, it will be slushy. Squeeze out the excess air. Place this bag inside another plastic bag. Slide the bag into a pillowcase or place a damp towel between your skin and the bag.   SEEK MEDICAL CARE IF:  You develop white spots on your skin. This may give the skin a blotchy (mottled) appearance.  Your skin turns blue or pale.  Your skin becomes waxy or hard.  Your swelling gets worse.  MAKE SURE YOU:  Understand these instructions.  Will watch your condition.  Will get help right away if you are not doing well or get worse.    Chronic  Pain Discharge Instructions  Emergency care providers appreciate that many patients coming to [place] are in severe pain and we wish to address their pain in the safest, most responsible manner.  It is important to recognize however, that the proper treatment of chronic pain differs from that of the pain of injuries and acute illnesses.  Our goal is to provide quality, safe, personalized care and we thank you for giving [place] the opportunity to serve you. The use of narcotics and related agents for chronic pain syndromes may lead to additional physical and psychological problems.  Nearly as many people die from prescription narcotics each year as die from car crashes.  Additionally, this risk is increased if such prescriptions are obtained from a variety of  sources.  Therefore, only your primary care physician or a pain management specialist is able to safely treat such syndromes with narcotic medications long-term.    Documentation revealing such prescriptions have been sought from multiple sources may prohibit [place] from providing a refill or different narcotic medication.  Your name may be checked first through the [facility] Controlled Substances Reporting System.  This database is a record of controlled substance medication prescriptions that the patient has received.  This has been established by [facility] in an effort to eliminate the dangerous, and often life threatening, practice of obtaining multiple prescriptions from different medical providers.   If you have a chronic pain syndrome (i.e. chronic headaches, recurrent back or neck pain, dental pain, abdominal or pelvis pain without a specific diagnosis, or neuropathic pain such as fibromyalgia) or recurrent visits for the same condition without an acute diagnosis, you may be treated with non-narcotics and other non-addictive medicines.  Allergic reactions or negative side effects that may be reported by a patient to such medications will not typically lead to the use of a narcotic analgesic or other controlled substance as an alternative.   Patients managing chronic pain with a personal physician should have provisions in place for breakthrough pain.  If you are in crisis, you should call your physician.  If your physician directs you to the emergency department, please have the doctor call and speak to our attending physician concerning your care.   When patients come to the Emergency Department (ED) with acute medical conditions in which the Emergency Department physician feels appropriate to prescribe narcotic or sedating pain medication, the physician will prescribe these in very limited quantities.  The amount of these medications will last only until you can see your primary care  physician in his/her office.  Any patient who returns to the ED seeking refills should expect only non-narcotic pain medications.   In the event of an acute medical condition exists and the emergency physician feels it is necessary that the patient be given a narcotic or sedating medication -  a responsible adult driver should be present in the room prior to the medication being given by the nurse.   Prescriptions for narcotic or sedating medications that have been lost, stolen or expired will not be refilled in the Emergency Department.    Patients who have chronic pain may receive non-narcotic prescriptions until seen by their primary care physician.  It is every patient's personal responsibility to maintain active prescriptions with his or her primary care physician or specialist.  RESOURCE GUIDE  Dental Problems  Patients with Medicaid: [facility]  [address]                                           [phone] [address] [employer] [address] Phone:  [phone]                                                  Phone:  [phone]  If unable to pay or uninsured, contact:  [facility] or [facility]. to become qualified for the adult dental clinic.  Chronic Pain Problems Contact [facility]  [phone] Patients need to be referred by their primary care doctor.  Insufficient Money for Medicine Contact [employer]:  call "211" or [facility] 903-869-7616.  No Primary Care Doctor Call [facility]  [phone] Other agencies that provide inexpensive medical care    [facility]  [phone]    [facility]  [phone]    [facility]  [phone]    [facility]  [phone]    [facility]  2120496486    [facility]  [phone]  Psychological Services [facility]  [phone] [facility] Services  [phone] [facility]   [phone] (emergency  services [phone])  Substance Abuse Resources [facility]  304-452-8351 [facility] 678-275-7828 [facility] 817-040-8452 Floydene Flock (743)464-4025 Residential & Outpatient Substance Abuse Program  712-743-9397  Abuse/Neglect [facility] Child Abuse Hotline 867-834-9120 [facility] Child Abuse Hotline 807-803-3377 (After Hours)  Emergency Shelter [facility] 430-826-3642  Maternity Homes [facility] [place] [facility] [place] 204-631-9897 [facility] [phone]  MRSA Hotline #:   2140382055    [facility] Resources  [facility]     [employer]                          [facility] Dept. [address] [place]                       [address]      [address]  Blondell Reveal Phone:  [phone]                                   Phone:  [phone]                 Phone:  [phone]  [facility] Phone:  [phone]  [facility] Child Abuse Hotline 4143077141 [phone] (After Hours)

## 2011-07-23 NOTE — ED Provider Notes (Signed)
Medical screening examination/treatment/procedure(s) were performed by non-physician practitioner and as supervising physician I was immediately available for consultation/collaboration.  Tiea Manninen M Ryah Cribb, MD 07/23/11 2118 

## 2011-08-12 ENCOUNTER — Emergency Department (HOSPITAL_COMMUNITY)
Admission: EM | Admit: 2011-08-12 | Discharge: 2011-08-12 | Disposition: A | Discharge status: Home / Self Care | Payer: Self-pay | Attending: Emergency Medicine | Admitting: Emergency Medicine

## 2011-08-12 ENCOUNTER — Encounter (HOSPITAL_COMMUNITY): Payer: Self-pay

## 2011-08-12 ENCOUNTER — Emergency Department (HOSPITAL_COMMUNITY): Payer: Self-pay

## 2011-08-12 DIAGNOSIS — F17200 Nicotine dependence, unspecified, uncomplicated: Secondary | ICD-10-CM | POA: Insufficient documentation

## 2011-08-12 DIAGNOSIS — S20219A Contusion of unspecified front wall of thorax, initial encounter: Secondary | ICD-10-CM | POA: Insufficient documentation

## 2011-08-12 DIAGNOSIS — R079 Chest pain, unspecified: Secondary | ICD-10-CM | POA: Insufficient documentation

## 2011-08-12 MED ORDER — HYDROCODONE-ACETAMINOPHEN 5-325 MG PO TABS
1.0000 | ORAL_TABLET | Freq: Once | ORAL | Status: AC
  Administered 2011-08-12: 1 via ORAL
  Filled 2011-08-12: qty 1

## 2011-08-12 MED ORDER — HYDROCODONE-ACETAMINOPHEN 5-325 MG PO TABS: 1.0000 | ORAL_TABLET | Freq: Once | ORAL | Status: AC
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
## 2011-08-12 NOTE — Discharge Instructions (Signed)
YOUR CHEST/RIB XRAY IS NEGATIVE FOR ANY RIB FRACTURE. YOU CAN BE DISCHARGED HOME TO FOLLOW UP WITH YOUR DOCTOR FOR RECHECK IF PAIN DOES NOT IMPROVE IN THE NEXT 2-3 DAYS. YOU HAVE BEEN GIVEN A LIMITED NUMBER OF NARCOTIC PAIN PILLS AND SHOULD TAKE TYLENOL AFTER THIS PRESCRIPTION IS COMPLETED FOR PAIN. IF PAIN IS STILL PRESENT, YOU SHOULD BE SEEING YOUR DOCTOR FOR FURTHER EVALUATION.  Chest Contusion A contusion is a deep bruise. Bruises happen when an injury causes bleeding under the skin. Signs of bruising include pain, puffiness (swelling), and discolored skin. The bruise may turn blue, purple, or yellow. Pay attention to how you are doing. HOME CARE  Put ice on the injured area.   Put ice in a plastic bag.   Place a towel between the skin and the bag.   Leave the ice on for 15 to 20 minutes at a time, 3 to 4 times a day for the first 48 hours.   Rest.   Do not lift anything heavy.   Limit your activity as told by your doctor   Take 3 to 4 deep breaths every hour while awake. Hold your hand or a pillow over the sore area for support.   Breathe from the belly (abdomen).   Breathe in through the nose, as if you are smelling a flower.   Breathe out through the mouth, as if you are blowing out a candle.   Only take medicine as told by your doctor.  GET HELP RIGHT AWAY IF:   You have trouble breathing or cough up thick spit (mucus).   You have chest pain that goes into the arms or jaw.   The skin is wet and pale.   You have a fever.   You feel dizzy, weak, or pass out (faint).   You cannot breathe easily.   The bruise is getting worse.  MAKE SURE YOU:   Understand these instructions.   Will watch your condition.   Will get help right away if you are not doing well or get worse.  Document Released: 09/25/2007 Document Revised: 03/28/2011 Document Reviewed: 09/25/2007 [facility] Patient Information 2012 [place], [place].

## 2011-08-14 NOTE — ED Provider Notes (Signed)
History/physical exam/procedure(s) were performed by non-physician practitioner and as supervising physician I was immediately available for consultation/collaboration. I have reviewed all notes and am in agreement with care and plan.   Volanda Mangine S Bora Broner, MD 08/14/11 1515 

## 2011-12-16 ENCOUNTER — Emergency Department (HOSPITAL_COMMUNITY): Payer: Self-pay

## 2011-12-16 ENCOUNTER — Emergency Department (HOSPITAL_COMMUNITY)
Admission: EM | Admit: 2011-12-16 | Discharge: 2011-12-16 | Disposition: A | Discharge status: Home / Self Care | Payer: Self-pay | Attending: Emergency Medicine | Admitting: Emergency Medicine

## 2011-12-16 ENCOUNTER — Encounter (HOSPITAL_COMMUNITY): Payer: Self-pay | Admitting: Emergency Medicine

## 2011-12-16 DIAGNOSIS — F17200 Nicotine dependence, unspecified, uncomplicated: Secondary | ICD-10-CM | POA: Insufficient documentation

## 2011-12-16 DIAGNOSIS — S6722XA Crushing injury of left hand, initial encounter: Secondary | ICD-10-CM

## 2011-12-16 DIAGNOSIS — M79609 Pain in unspecified limb: Secondary | ICD-10-CM | POA: Insufficient documentation

## 2011-12-16 MED ORDER — HYDROCODONE-ACETAMINOPHEN 5-325 MG PO TABS
2.0000 | ORAL_TABLET | Freq: Once | ORAL | Status: AC
  Administered 2011-12-16: 2 via ORAL
  Filled 2011-12-16: qty 2

## 2011-12-16 MED ORDER — HYDROCODONE-ACETAMINOPHEN 5-325 MG PO TABS: 2.0000 | ORAL_TABLET | ORAL | Status: AC | PRN

## 2011-12-16 NOTE — ED Provider Notes (Signed)
History     CSN: [id number]  Arrival date & time 12/16/11  1629   First MD Initiated Contact with Patient 12/16/11 1639      Chief Complaint  Patient presents with  . Hand Pain    (Consider location/radiation/quality/duration/timing/severity/associated sxs/prior treatment) HPI Comments: Patient presents with left hand pain for that past few hours. She reports accidently slamming her hand in her apartment door a few hours ago. She reports pain shortly after the injury and bruising/swelling as well. She reports localization of her pain to her hand. She did not take anything for the pain. She reports associated tingling of her left hand and weakness. She denies any coolness of extremities.   Patient is a 31 y.o. female presenting with hand pain.  Hand Pain Associated symptoms include arthralgias, joint swelling, numbness and weakness. Pertinent negatives include no chills, diaphoresis, fatigue, fever, nausea or vomiting.    Past Medical History  Diagnosis Date  . Nerve disorder     No past surgical history on file.  Family History  Problem Relation Age of Onset  . Hypertension Mother   . Heart failure Mother   . Heart failure Father     History  Substance Use Topics  . Smoking status: Current Everyday Smoker -- 1.0 packs/day for 10 years    Types: Cigarettes  . Smokeless tobacco: Never Used  . Alcohol Use: No    OB History    Grav Para Term Preterm Abortions TAB SAB Ect Mult Living                  Review of Systems  Constitutional: Negative for fever, chills, diaphoresis and fatigue.  Gastrointestinal: Negative for nausea, vomiting and diarrhea.  Musculoskeletal: Positive for joint swelling and arthralgias.  Skin: Positive for color change. Negative for wound.  Neurological: Positive for weakness and numbness.    Allergies  Aspirin; Ibuprofen; Penicillins; and Tramadol  Home Medications  No current outpatient prescriptions on file.  BP 141/64  Pulse 70   Temp 98.6 F (37 C) (Oral)  Resp 16  SpO2 96%  LMP 12/16/2011  Physical Exam  Nursing note and vitals reviewed. Constitutional: She is oriented to person, place, and time. She appears well-developed and well-nourished. No distress.  HENT:  Head: Normocephalic and atraumatic.  Eyes: Conjunctivae are normal. No scleral icterus.  Neck: Normal range of motion.  Cardiovascular: Normal rate, regular rhythm and intact distal pulses.  Exam reveals no gallop and no friction rub.   No murmur heard.      Sufficient capillary refill of upper extremities.   Pulmonary/Chest: Effort normal. No respiratory distress. She has no wheezes. She has no rales. She exhibits no tenderness.  Musculoskeletal: She exhibits edema and tenderness.       Palpation of left wrist non tender. Palpation of left MCP joints tender, especially second and third phalanges. Ecchymoses and edema noted at distal metacarpal joints. Left phalanges have full ROM.   Neurological: She is alert and oriented to person, place, and time.       Left grip strength diminished due to pain and swelling. Sensation intact and equal of bilaterally upper extremities.   Skin: Skin is warm and dry. She is not diaphoretic.  Psychiatric: She has a normal mood and affect. Her behavior is normal.    ED Course  Procedures (including critical care time)  Labs Reviewed - No data to display Dg Hand Complete Left  12/16/2011  *RADIOLOGY REPORT*  Clinical Data: Injury, pain.  LEFT HAND - COMPLETE 3+ VIEW  Comparison: None.  Findings: Imaged bones, joints and soft tissues appear normal.  IMPRESSION: Negative exam.   Original Report Authenticated By: Bernadene Bell. Maricela Curet, M.D.      No diagnosis found.    MDM  6:00 PM No fracture noted on xray. Patient will be discharged with pain medication and instructions for RICE therapy. No further evaluation needed at this time. Patient should return with any worsening or concerning symptoms.          Emilia Beck, PA-C 12/16/11 1812
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
## 2011-12-17 NOTE — ED Provider Notes (Signed)
Medical screening examination/treatment/procedure(s) were performed by non-physician practitioner and as supervising physician I was immediately available for consultation/collaboration.  Toy Baker, MD 12/17/11 2340

## 2012-07-27 ENCOUNTER — Encounter (HOSPITAL_COMMUNITY): Payer: Self-pay | Admitting: *Deleted

## 2012-07-27 ENCOUNTER — Emergency Department (HOSPITAL_COMMUNITY): Payer: Self-pay

## 2012-07-27 ENCOUNTER — Emergency Department (HOSPITAL_COMMUNITY)
Admission: EM | Admit: 2012-07-27 | Discharge: 2012-07-28 | Disposition: A | Discharge status: Home / Self Care | Payer: Self-pay | Attending: Emergency Medicine | Admitting: Emergency Medicine

## 2012-07-27 DIAGNOSIS — F17200 Nicotine dependence, unspecified, uncomplicated: Secondary | ICD-10-CM | POA: Insufficient documentation

## 2012-07-27 DIAGNOSIS — Z3202 Encounter for pregnancy test, result negative: Secondary | ICD-10-CM | POA: Insufficient documentation

## 2012-07-27 DIAGNOSIS — R000 Tachycardia, unspecified: Secondary | ICD-10-CM | POA: Insufficient documentation

## 2012-07-27 DIAGNOSIS — R112 Nausea with vomiting, unspecified: Secondary | ICD-10-CM | POA: Insufficient documentation

## 2012-07-27 DIAGNOSIS — R300 Dysuria: Secondary | ICD-10-CM | POA: Insufficient documentation

## 2012-07-27 DIAGNOSIS — E669 Obesity, unspecified: Secondary | ICD-10-CM | POA: Insufficient documentation

## 2012-07-27 DIAGNOSIS — N72 Inflammatory disease of cervix uteri: Secondary | ICD-10-CM | POA: Insufficient documentation

## 2012-07-27 DIAGNOSIS — Z8669 Personal history of other diseases of the nervous system and sense organs: Secondary | ICD-10-CM | POA: Insufficient documentation

## 2012-07-27 DIAGNOSIS — R509 Fever, unspecified: Secondary | ICD-10-CM | POA: Insufficient documentation

## 2012-07-27 LAB — COMPREHENSIVE METABOLIC PANEL
AST: 16 U/L (ref 0–37)
Albumin: 3.8 g/dL (ref 3.5–5.2)
BUN: 8 mg/dL (ref 6–23)
CO2: 26 mEq/L (ref 19–32)
Calcium: 9.5 mg/dL (ref 8.4–10.5)
Chloride: 100 mEq/L (ref 96–112)
Creatinine, Ser: 0.65 mg/dL (ref 0.50–1.10)
GFR calc non Af Amer: >90 mL/min (ref >90)
Total Bilirubin: 0.3 mg/dL (ref 0.3–1.2)

## 2012-07-27 LAB — URINE MICROSCOPIC-ADD ON

## 2012-07-27 LAB — WET PREP, GENITAL: Trich, Wet Prep: NONE SEEN

## 2012-07-27 LAB — CBC WITH DIFFERENTIAL/PLATELET
Basophils Relative: 0 % (ref 0–1)
Eosinophils Absolute: 0.3 10*3/uL (ref 0.0–0.7)
Eosinophils Relative: 2 % (ref 0–5)
Hemoglobin: 14 g/dL (ref 12.0–15.0)
Lymphs Abs: 4.8 10*3/uL — ABNORMAL HIGH (ref 0.7–4.0)
MCH: 28.9 pg (ref 26.0–34.0)
MCHC: 34.5 g/dL (ref 30.0–36.0)
MCV: 83.9 fL (ref 78.0–100.0)
Monocytes Absolute: 0.6 10*3/uL (ref 0.1–1.0)
Monocytes Relative: 5 % (ref 3–12)
RBC: 4.84 MIL/uL (ref 3.87–5.11)

## 2012-07-27 LAB — URINALYSIS, ROUTINE W REFLEX MICROSCOPIC
Glucose, UA: NEGATIVE mg/dL
Specific Gravity, Urine: 1.021 (ref 1.005–1.030)
pH: 6 (ref 5.0–8.0)

## 2012-07-27 LAB — POCT PREGNANCY, URINE: Preg Test, Ur: NEGATIVE

## 2012-07-27 LAB — LIPASE, BLOOD: Lipase: 22 U/L (ref 11–59)

## 2012-07-27 MED ORDER — SODIUM CHLORIDE 0.9 % IV SOLN: INTRAVENOUS | Status: DC

## 2012-07-27 MED ORDER — HYDROMORPHONE HCL PF 1 MG/ML IJ SOLN
1.0000 mg | Freq: Once | INTRAMUSCULAR | Status: AC
  Administered 2012-07-27: 1 mg via INTRAVENOUS
  Filled 2012-07-27: qty 1

## 2012-07-27 MED ORDER — IOHEXOL 300 MG/ML  SOLN
100.0000 mL | Freq: Once | INTRAMUSCULAR | Status: AC | PRN
  Administered 2012-07-27: 100 mL via INTRAVENOUS

## 2012-07-27 MED ORDER — SODIUM CHLORIDE 0.9 % IV BOLUS (SEPSIS)
1000.0000 mL | Freq: Once | INTRAVENOUS | Status: AC
  Administered 2012-07-27: 1000 mL via INTRAVENOUS

## 2012-07-27 MED ORDER — IOHEXOL 300 MG/ML  SOLN
50.0000 mL | Freq: Once | INTRAMUSCULAR | Status: AC | PRN
  Administered 2012-07-27: 50 mL via ORAL

## 2012-07-27 MED ORDER — ONDANSETRON HCL 4 MG/2ML IJ SOLN
4.0000 mg | Freq: Once | INTRAMUSCULAR | Status: AC
  Administered 2012-07-27: 4 mg via INTRAVENOUS
  Filled 2012-07-27: qty 2

## 2012-07-27 NOTE — ED Provider Notes (Signed)
History    This chart was scribed for Jaci Carrel, PA-C, a non-physician practitioner working with Lyanne Co, MD by Lewanda Rife, ED Scribe. This patient was seen in room WA14/WA14 and the patient's care was started at 2223.     CSN: [id number]  Arrival date & time 07/27/12  1908   First MD Initiated Contact with Patient 07/27/12 2202      Chief Complaint  Patient presents with  . Abdominal Pain  . Fever  . Nausea  . Emesis    (Consider location/radiation/quality/duration/timing/severity/associated sxs/prior treatment) HPI Cathy Garcia is a 32 y.o. female who presents to the Emergency Department complaining of gradually worsening, right lower quadrant abdominal pain, onset yesterday. Pain dies not radiate & is described as sharp. Associated s/s include dysuria, nausea and emesis x2. No known exacerbating or alleviating factors. Pt denies diarrhea, fevers, NS/ chills, hematuria, melena, hematochezia, vaginal discharge, dyspareunia, or vaginal bleeding. Patient's last menstrual period was 07/11/2012. Pt is currently sexually active with one partner adn does not use protection. Pt denies taking any medications prior to arrival "because I can't take anything until I come to the hospital". No similar presentation in past. No abdominal surgical hx. LNBM yesterday.   Past Medical History  Diagnosis Date  . Nerve disorder     No past surgical history on file.  Family History  Problem Relation Age of Onset  . Hypertension Mother   . Heart failure Mother   . Heart failure Father     History  Substance Use Topics  . Smoking status: Current Every Day Smoker -- 1.00 packs/day for 10 years    Types: Cigarettes  . Smokeless tobacco: Never Used  . Alcohol Use: No    OB History   Grav Para Term Preterm Abortions TAB SAB Ect Mult Living                  Review of Systems  All other systems reviewed and are negative.   A complete 10 system review of systems was  obtained and all systems are negative except as noted in the HPI and PMH.   Allergies  Aspirin; Ibuprofen; Penicillins; and Tramadol  Home Medications  No current outpatient prescriptions on file.  BP 129/88  Pulse 112  Temp(Src) 98.2 F (36.8 C) (Oral)  Resp 20  SpO2 99%  LMP 07/11/2012  Physical Exam  Nursing note and vitals reviewed. Constitutional: She is oriented to person, place, and time. She appears well-developed and well-nourished. No distress.  Obese w hirtuism  HENT:  Head: Normocephalic and atraumatic.  Eyes: Conjunctivae and EOM are normal.  Neck: Normal range of motion.  Cardiovascular:  tachycardic  Pulmonary/Chest: Effort normal.  LCAB  Abdominal:  Soft abd with normal bowel sounds. RLQ ttp. No peritoneal signs.   Genitourinary:  Exam performed by Jaci Carrel,  exam chaperoned Date: 07/28/2012 VAGINA: normal appearing vagina with normal color and discharge, no lesions. CERVIX: normal appearing cervix without lesions, cervical motion tenderness present (but no chandelier sign); vaginal discharge - white, copious and creamy, Wet prep and DNA probe for chlamydia and GC obtained.      Musculoskeletal: Normal range of motion.  Neurological: She is alert and oriented to person, place, and time.  Skin: Skin is warm and dry. No rash noted. She is not diaphoretic.  Psychiatric: She has a normal mood and affect. Her behavior is normal.    ED Course  Procedures (including critical care time) Medications  0.9 %  sodium chloride infusion (not administered)  azithromycin (ZITHROMAX) tablet 1,000 mg (not administered)  cefTRIAXone (ROCEPHIN) injection 250 mg (not administered)  ondansetron (ZOFRAN) injection 4 mg (4 mg Intravenous Given 07/27/12 2253)  sodium chloride 0.9 % bolus 1,000 mL (0 mLs Intravenous Stopped 07/28/12 0002)  HYDROmorphone (DILAUDID) injection 1 mg (1 mg Intravenous Given 07/27/12 2253)  iohexol (OMNIPAQUE) 300 MG/ML solution 50 mL (50 mLs Oral  Contrast Given 07/27/12 2235)  iohexol (OMNIPAQUE) 300 MG/ML solution 100 mL (100 mLs Intravenous Contrast Given 07/27/12 2305)    Labs Reviewed  URINALYSIS, ROUTINE W REFLEX MICROSCOPIC - Abnormal; Notable for the following:    Hgb urine dipstick SMALL (*)    All other components within normal limits  URINE MICROSCOPIC-ADD ON - Abnormal; Notable for the following:    Squamous Epithelial / LPF MANY (*)    Bacteria, UA FEW (*)    All other components within normal limits  WET PREP, GENITAL  GC/CHLAMYDIA PROBE AMP  COMPREHENSIVE METABOLIC PANEL  LIPASE, BLOOD  CBC WITH DIFFERENTIAL  CBC WITH DIFFERENTIAL  POCT PREGNANCY, URINE   Ct Abdomen Pelvis W Contrast  07/27/2012  *RADIOLOGY REPORT*  Clinical Data: Weakness and right lower quadrant abdominal pain. Fever, nausea, vomiting and dysuria.  CT ABDOMEN AND PELVIS WITH CONTRAST  Technique:  Multidetector CT imaging of the abdomen and pelvis was performed following the standard protocol during bolus administration of intravenous contrast.  Contrast: 100 mL of Omnipaque 300 IV contrast  Comparison: Pelvic ultrasound performed 06/22/2011  Findings: The visualized lung bases are clear.  The liver and spleen are unremarkable in appearance.  The gallbladder is within normal limits.  The pancreas and adrenal glands are unremarkable.  Incidental note is made of a mildly prominent 1.3 cm peripancreatic node, near the gastric fundus.  The kidneys are unremarkable in appearance.  There is no evidence of hydronephrosis.  No renal or ureteral stones are seen.  No perinephric stranding is appreciated.  The small bowel is unremarkable in appearance.  The stomach is within normal limits.  No acute vascular abnormalities are seen.  The appendix is normal in caliber and contains air, without evidence for appendicitis.  The colon is unremarkable in appearance.  The bladder is mildly distended and grossly unremarkable.  The uterus is grossly normal in appearance.  The  ovaries are relatively symmetric; no suspicious adnexal masses are seen.  Trace fluid within the pelvis is likely physiologic in nature.  No inguinal lymphadenopathy is seen.  No acute osseous abnormalities are identified.  IMPRESSION:  1.  No acute abnormality seen within the abdomen or pelvis. 2.  Incidental note of a mildly prominent 1.3 cm peripancreatic node, near the gastric fundus.   Original Report Authenticated By: Tonia Ghent, M.D.      No diagnosis found.    MDM  RLQ abd pain  Patient to be discharged with instructions to follow up with OBGYN. Pt understands that they have GC/Chlamydia cultures pending and that they will need to inform all sexual partners if results return positive. Pt has been treated prophylacticly with azithromycin and rocephin due to pts history, pelvic exam, and wet prep with increased WBCs. Pt will also be treated with doxy at dc for concern for PID as pt had moderate WBCs and mild cervical motion tenderness. D/t tenderness on pelvic exam and leukocytosis pt will have pelvic US. Care to be resumed by oncoming provider who will continue disposition accordingly.     I personally performed the services described in  this documentation, which was scribed in my presence. The recorded information has been reviewed and is accurate.         Jaci Carrel, [place] 07/28/12 0020

## 2012-07-27 NOTE — ED Notes (Signed)
Pt states she has right lower quadrant pain with fever and nausea,  And vomiting,  Decreased appetite since yesterday,  No diarrhea noted,  Painful burning urination

## 2012-07-28 ENCOUNTER — Emergency Department (HOSPITAL_COMMUNITY): Payer: Self-pay

## 2012-07-28 LAB — GC/CHLAMYDIA PROBE AMP: CT Probe RNA: NEGATIVE

## 2012-07-28 MED ORDER — HYDROMORPHONE HCL PF 1 MG/ML IJ SOLN
1.0000 mg | Freq: Once | INTRAMUSCULAR | Status: AC
  Administered 2012-07-28: 1 mg via INTRAVENOUS
  Filled 2012-07-28: qty 1

## 2012-07-28 MED ORDER — PERCOCET 5-325 MG PO TABS: 1.0000 | ORAL_TABLET | Freq: Four times a day (QID) | ORAL | Status: DC | PRN

## 2012-07-28 MED ORDER — AZITHROMYCIN 250 MG PO TABS
1000.0000 mg | ORAL_TABLET | Freq: Once | ORAL | Status: AC
  Administered 2012-07-28: 1000 mg via ORAL
  Filled 2012-07-28: qty 4

## 2012-07-28 MED ORDER — CEFTRIAXONE SODIUM 250 MG IJ SOLR
250.0000 mg | Freq: Once | INTRAMUSCULAR | Status: AC
  Administered 2012-07-28: 250 mg via INTRAMUSCULAR
  Filled 2012-07-28: qty 250

## 2012-07-28 MED ORDER — DOXYCYCLINE HYCLATE 100 MG PO CAPS: 100.0000 mg | ORAL_CAPSULE | Freq: Two times a day (BID) | ORAL | Status: DC

## 2012-07-28 NOTE — ED Provider Notes (Signed)
Medical screening examination/treatment/procedure(s) were performed by non-physician practitioner and as supervising physician I was immediately available for consultation/collaboration.    Lyanne Co, MD 07/28/12 1900

## 2012-07-28 NOTE — ED Provider Notes (Signed)
Medical screening examination/treatment/procedure(s) were performed by non-physician practitioner and as supervising physician I was immediately available for consultation/collaboration.  Lyanne Co, MD 07/28/12 [phone]

## 2012-07-28 NOTE — ED Provider Notes (Signed)
  Physical Exam  BP 123/66  Pulse 69  Temp(Src) 97.9 F (36.6 C) (Oral)  Resp 18  SpO2 96%  LMP 07/11/2012  Physical Exam S. reviewed.  Ultrasound to make, sure that there was no TO.  A that is normal.  Patient is discharged.  For the instructions with prescription for pain control, and doxycycline as an antibiotic for her cervicitis/PID ED Course  Procedures  MDM       Arman Filter, NP 07/28/12 0209

## 2013-04-17 ENCOUNTER — Encounter (HOSPITAL_COMMUNITY): Payer: Self-pay | Admitting: Emergency Medicine

## 2013-04-17 ENCOUNTER — Emergency Department (HOSPITAL_COMMUNITY)
Admission: EM | Admit: 2013-04-17 | Discharge: 2013-04-17 | Disposition: A | Discharge status: Home / Self Care | Payer: Self-pay | Attending: Emergency Medicine | Admitting: Emergency Medicine

## 2013-04-17 ENCOUNTER — Emergency Department (HOSPITAL_COMMUNITY): Payer: Self-pay

## 2013-04-17 DIAGNOSIS — Z880 Allergy status to penicillin: Secondary | ICD-10-CM | POA: Insufficient documentation

## 2013-04-17 DIAGNOSIS — H9190 Unspecified hearing loss, unspecified ear: Secondary | ICD-10-CM | POA: Insufficient documentation

## 2013-04-17 DIAGNOSIS — S0003XA Contusion of scalp, initial encounter: Secondary | ICD-10-CM | POA: Insufficient documentation

## 2013-04-17 DIAGNOSIS — Z792 Long term (current) use of antibiotics: Secondary | ICD-10-CM | POA: Insufficient documentation

## 2013-04-17 DIAGNOSIS — H6590 Unspecified nonsuppurative otitis media, unspecified ear: Secondary | ICD-10-CM | POA: Insufficient documentation

## 2013-04-17 DIAGNOSIS — S0083XA Contusion of other part of head, initial encounter: Secondary | ICD-10-CM

## 2013-04-17 DIAGNOSIS — H65191 Other acute nonsuppurative otitis media, right ear: Secondary | ICD-10-CM

## 2013-04-17 DIAGNOSIS — F17200 Nicotine dependence, unspecified, uncomplicated: Secondary | ICD-10-CM | POA: Insufficient documentation

## 2013-04-17 DIAGNOSIS — S0990XA Unspecified injury of head, initial encounter: Secondary | ICD-10-CM | POA: Insufficient documentation

## 2013-04-17 MED ORDER — OXYCODONE-ACETAMINOPHEN 5-325 MG PO TABS: 1.0000 | ORAL_TABLET | Freq: Four times a day (QID) | ORAL | Status: DC | PRN

## 2013-04-17 MED ORDER — HYDROCODONE-ACETAMINOPHEN 5-325 MG PO TABS
1.0000 | ORAL_TABLET | Freq: Once | ORAL | Status: AC
  Administered 2013-04-17: 1 via ORAL
  Filled 2013-04-17: qty 1

## 2013-04-17 MED ORDER — AZITHROMYCIN 250 MG PO TABS: ORAL_TABLET | ORAL | Status: DC
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
## 2013-04-17 NOTE — ED Provider Notes (Signed)
Medical screening examination/treatment/procedure(s) were performed by non-physician practitioner and as supervising physician I was immediately available for consultation/collaboration.  EKG Interpretation   None         Lyanne Co, MD 04/17/13 [phone]
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
## 2013-06-03 ENCOUNTER — Emergency Department (HOSPITAL_COMMUNITY)
Admission: EM | Admit: 2013-06-03 | Discharge: 2013-06-03 | Disposition: A | Discharge status: Home / Self Care | Payer: Self-pay | Attending: Emergency Medicine | Admitting: Emergency Medicine

## 2013-06-03 ENCOUNTER — Encounter (HOSPITAL_COMMUNITY): Payer: Self-pay | Admitting: Emergency Medicine

## 2013-06-03 ENCOUNTER — Emergency Department (HOSPITAL_COMMUNITY): Payer: Self-pay

## 2013-06-03 DIAGNOSIS — S0083XA Contusion of other part of head, initial encounter: Secondary | ICD-10-CM

## 2013-06-03 DIAGNOSIS — F17200 Nicotine dependence, unspecified, uncomplicated: Secondary | ICD-10-CM | POA: Insufficient documentation

## 2013-06-03 DIAGNOSIS — W540XXA Bitten by dog, initial encounter: Secondary | ICD-10-CM | POA: Insufficient documentation

## 2013-06-03 DIAGNOSIS — Z880 Allergy status to penicillin: Secondary | ICD-10-CM | POA: Insufficient documentation

## 2013-06-03 DIAGNOSIS — S0003XA Contusion of scalp, initial encounter: Secondary | ICD-10-CM | POA: Insufficient documentation

## 2013-06-03 DIAGNOSIS — S1093XA Contusion of unspecified part of neck, initial encounter: Secondary | ICD-10-CM

## 2013-06-03 DIAGNOSIS — T148XXA Other injury of unspecified body region, initial encounter: Secondary | ICD-10-CM

## 2013-06-03 DIAGNOSIS — Y92410 Unspecified street and highway as the place of occurrence of the external cause: Secondary | ICD-10-CM | POA: Insufficient documentation

## 2013-06-03 DIAGNOSIS — R011 Cardiac murmur, unspecified: Secondary | ICD-10-CM | POA: Insufficient documentation

## 2013-06-03 DIAGNOSIS — Z23 Encounter for immunization: Secondary | ICD-10-CM | POA: Insufficient documentation

## 2013-06-03 DIAGNOSIS — R296 Repeated falls: Secondary | ICD-10-CM | POA: Insufficient documentation

## 2013-06-03 DIAGNOSIS — Y9301 Activity, walking, marching and hiking: Secondary | ICD-10-CM | POA: Insufficient documentation

## 2013-06-03 DIAGNOSIS — S31109A Unspecified open wound of abdominal wall, unspecified quadrant without penetration into peritoneal cavity, initial encounter: Secondary | ICD-10-CM | POA: Insufficient documentation

## 2013-06-03 HISTORY — DX: Cardiac murmur, unspecified: R01.1

## 2013-06-03 LAB — CBC WITH DIFFERENTIAL/PLATELET
Basophils Absolute: 0 10*3/uL (ref 0.0–0.1)
Basophils Relative: 0 % (ref 0–1)
EOS PCT: 1 % (ref 0–5)
Eosinophils Absolute: 0.2 10*3/uL (ref 0.0–0.7)
HEMATOCRIT: 42.2 % (ref 36.0–46.0)
Hemoglobin: 14.2 g/dL (ref 12.0–15.0)
LYMPHS ABS: 5.1 10*3/uL — AB (ref 0.7–4.0)
LYMPHS PCT: 43 % (ref 12–46)
MCH: 28.3 pg (ref 26.0–34.0)
MCHC: 33.6 g/dL (ref 30.0–36.0)
MCV: 84.2 fL (ref 78.0–100.0)
MONO ABS: 0.7 10*3/uL (ref 0.1–1.0)
Monocytes Relative: 6 % (ref 3–12)
NEUTROS ABS: 6 10*3/uL (ref 1.7–7.7)
Neutrophils Relative %: 50 % (ref 43–77)
Platelets: 298 10*3/uL (ref 150–400)
RBC: 5.01 MIL/uL (ref 3.87–5.11)
RDW: 13.1 % (ref 11.5–15.5)
WBC: 12 10*3/uL — AB (ref 4.0–10.5)

## 2013-06-03 LAB — BASIC METABOLIC PANEL
BUN: 9 mg/dL (ref 6–23)
CHLORIDE: 100 meq/L (ref 96–112)
CO2: 23 meq/L (ref 19–32)
Calcium: 9.6 mg/dL (ref 8.4–10.5)
Creatinine, Ser: 0.86 mg/dL (ref 0.50–1.10)
GFR calc Af Amer: >90 mL/min (ref >90)
GFR calc non Af Amer: 89 mL/min — ABNORMAL LOW (ref >90)
Glucose, Bld: 81 mg/dL (ref 70–99)
POTASSIUM: 4 meq/L (ref 3.7–5.3)
SODIUM: 137 meq/L (ref 137–147)

## 2013-06-03 MED ORDER — MORPHINE SULFATE 4 MG/ML IJ SOLN
4.0000 mg | Freq: Once | INTRAMUSCULAR | Status: AC
  Administered 2013-06-03: 4 mg via INTRAMUSCULAR
  Filled 2013-06-03: qty 1

## 2013-06-03 MED ORDER — RABIES IMMUNE GLOBULIN 150 UNIT/ML IM INJ
20.0000 [IU]/kg | INJECTION | Freq: Once | INTRAMUSCULAR | Status: AC
  Administered 2013-06-03: 1575 [IU] via INTRAMUSCULAR
  Filled 2013-06-03: qty 10.5

## 2013-06-03 MED ORDER — RABIES VACCINE, PCEC IM SUSR
1.0000 mL | Freq: Once | INTRAMUSCULAR | Status: AC
  Administered 2013-06-03: 1 mL via INTRAMUSCULAR
  Filled 2013-06-03: qty 1

## 2013-06-03 MED ORDER — ONDANSETRON 4 MG PO TBDP
4.0000 mg | ORAL_TABLET | Freq: Once | ORAL | Status: AC
  Administered 2013-06-03: 4 mg via ORAL
  Filled 2013-06-03: qty 1

## 2013-06-03 MED ORDER — TETANUS-DIPHTH-ACELL PERTUSSIS 5-2.5-18.5 LF-MCG/0.5 IM SUSP
0.5000 mL | Freq: Once | INTRAMUSCULAR | Status: AC
  Administered 2013-06-03: 0.5 mL via INTRAMUSCULAR
  Filled 2013-06-03: qty 0.5

## 2013-06-03 MED ORDER — OXYCODONE-ACETAMINOPHEN 5-325 MG PO TABS
1.0000 | ORAL_TABLET | Freq: Once | ORAL | Status: AC
  Administered 2013-06-03: 1 via ORAL
  Filled 2013-06-03: qty 1

## 2013-06-03 NOTE — Discharge Instructions (Signed)
Animal Bite Animal bite wounds can get infected. It is important to get proper medical treatment. Ask your doctor if you need a rabies shot. HOME CARE   Follow your doctor's instructions for taking care of your wound.  Only take medicine as told by your doctor.  Take your medicine (antibiotics) as told. Finish them even if you start to feel better.  Keep all doctor visits as told. You may need a tetanus shot if:   You cannot remember when you had your last tetanus shot.  You have never had a tetanus shot.  The injury broke your skin. If you need a tetanus shot and you choose not to have one, you may get tetanus. Sickness from tetanus can be serious. GET HELP RIGHT AWAY IF:   Your wound is warm, red, sore, or puffy (swollen).  You notice yellowish-white fluid (pus) or a bad smell coming from the wound.  You see a red line on the skin coming from the wound.  You have a fever, chills, or you feel sick.  You feel sick to your stomach (nauseous), or you throw up (vomit).  Your pain does not go away, or it gets worse.  You have trouble moving the injured part.  You have questions or concerns. MAKE SURE YOU:   Understand these instructions.  Will watch your condition.  Will get help right away if you are not doing well or get worse. Document Released: 04/08/2005 Document Revised: 07/01/2011 Document Reviewed: 11/28/2010 [facility] Patient Information 2014 [place], [place].  Facial or Scalp Contusion A facial or scalp contusion is a deep bruise on the face or head. Injuries to the face and head generally cause a lot of swelling, especially around the eyes. Contusions are the result of an injury that caused bleeding under the skin. The contusion may turn blue, purple, or yellow. Minor injuries will give you a painless contusion, but more severe contusions may stay painful and swollen for a few weeks.  CAUSES  A facial or scalp contusion is caused by a blunt injury or trauma to  the face or head area.  SIGNS AND SYMPTOMS   Swelling of the injured area.   Discoloration of the injured area.   Tenderness, soreness, or pain in the injured area.  DIAGNOSIS  The diagnosis can be made by taking a medical history and doing a physical exam. An X-ray exam, CT scan, or MRI may be needed to determine if there are any associated injuries, such as broken bones (fractures). TREATMENT  Often, the best treatment for a facial or scalp contusion is applying cold compresses to the injured area. Over-the-counter medicines may also be recommended for pain control.  HOME CARE INSTRUCTIONS   Only take over-the-counter or prescription medicines as directed by your health care provider.   Apply ice to the injured area.   Put ice in a plastic bag.   Place a towel between your skin and the bag.   Leave the ice on for 20 minutes, 2 3 times a day.  SEEK MEDICAL CARE IF:  You have bite problems.   You have pain with chewing.   You are concerned about facial defects. SEEK IMMEDIATE MEDICAL CARE IF:  You have severe pain or a headache that is not relieved by medicine.   You have unusual sleepiness, confusion, or personality changes.   You throw up (vomit).   You have a persistent nosebleed.   You have double vision or blurred vision.   You have fluid  drainage from your nose or ear.   You have difficulty walking or using your arms or legs.  MAKE SURE YOU:   Understand these instructions.  Will watch your condition.  Will get help right away if you are not doing well or get worse. Document Released: 05/16/2004 Document Revised: 01/27/2013 Document Reviewed: 11/19/2012 [facility] Patient Information 2014 [place], [place].

## 2013-06-03 NOTE — ED Provider Notes (Signed)
Medical screening examination/treatment/procedure(s) were performed by non-physician practitioner and as supervising physician I was immediately available for consultation/collaboration.  EKG Interpretation   None         Lenzie Sandler T Inella Kuwahara, MD 06/03/13 2356 

## 2013-06-03 NOTE — ED Notes (Signed)
Patient is alert and oriented x3.  She was given DC instructions and follow up visit instructions.  Patient gave verbal understanding. She was DC ambulatory under her own power to home.  V/S stable.  He was not showing any signs of distress on DC 

## 2013-06-03 NOTE — ED Provider Notes (Signed)
CSN: [id number]     Arrival date & time 06/03/13  1925 History   First MD Initiated Contact with Patient 06/03/13 1945     Chief Complaint  Patient presents with  . Animal Bite     (Consider location/radiation/quality/duration/timing/severity/associated sxs/prior Treatment) HPI  Patient to the ER with complaints of dog bite that happened on Monday, 3 days ago.  She reports a stray dog ran to her and bite her afterwards she fell onto her side and hit her face in the ground. Her abdomen has not been sore but her head, face and neck have been painful. She denies loc. She reports late presentation due to not wanting to take an ambulance and just now getting a ride to come to the hospital. She asks for pain medication and advises me of her many allergies. Patient awake, alert and oriented  Past Medical History  Diagnosis Date  . Nerve disorder   . Heart murmur    History reviewed. No pertinent past surgical history. Family History  Problem Relation Age of Onset  . Hypertension Mother   . Heart failure Mother   . Heart failure Father    History  Substance Use Topics  . Smoking status: Current Every Day Smoker -- 0.50 packs/day for 10 years    Types: Cigarettes  . Smokeless tobacco: Never Used  . Alcohol Use: No   OB History   Grav Para Term Preterm Abortions TAB SAB Ect Mult Living                 Review of Systems  The patient denies anorexia, fever, weight loss,, vision loss, decreased hearing, hoarseness, chest pain, syncope, dyspnea on exertion, peripheral edema, balance deficits, hemoptysis, abdominal pain, melena, hematochezia, severe indigestion/heartburn, hematuria, incontinence, genital sores, muscle weakness, suspicious skin lesions, transient blindness, difficulty walking, depression, unusual weight change, abnormal bleeding, enlarged lymph nodes, angioedema, and breast masses.   Allergies  Aspirin; Ibuprofen; Nsaids; Penicillins; and Tramadol  Home Medications    Current Outpatient Rx  Name  Route  Sig  Dispense  Refill  . acetaminophen (TYLENOL) 500 MG tablet   Oral   Take 500 mg by mouth every 6 (six) hours as needed for mild pain or moderate pain.          BP 123/67  Pulse 89  Temp(Src) 98.1 F (36.7 C) (Oral)  Resp 18  Wt 176 lb (79.833 kg)  SpO2 99%  LMP 05/14/2013 Physical Exam  Nursing note and vitals reviewed. Constitutional: She is oriented to person, place, and time. She appears well-developed and well-nourished. No distress.  HENT:  Head: Normocephalic. Head is with contusion and with right periorbital erythema. Head is without raccoon's eyes, without Battle's sign, without abrasion and without laceration.  Right Ear: Tympanic membrane and ear canal normal.  Left Ear: Tympanic membrane and ear canal normal.  Nose: No nose lacerations or sinus tenderness. Right sinus exhibits no maxillary sinus tenderness and no frontal sinus tenderness. Left sinus exhibits no maxillary sinus tenderness and no frontal sinus tenderness.  Mouth/Throat: Oropharynx is clear and moist and mucous membranes are normal.  Eyes: Pupils are equal, round, and reactive to light.  Neck: Normal range of motion. Neck supple. Muscular tenderness present. No spinous process tenderness present. Normal range of motion present.  Cardiovascular: Normal rate and regular rhythm.   Pulmonary/Chest: Effort normal.  Abdominal: Soft.  Neurological: She is alert and oriented to person, place, and time. She has normal strength. No cranial nerve deficit or  sensory deficit. She displays a negative Romberg sign.  Skin: Skin is warm and dry.    ED Course  Procedures (including critical care time) Labs Review Labs Reviewed  BASIC METABOLIC PANEL - Abnormal; Notable for the following:    GFR calc non Af Amer 89 (*)    All other components within normal limits  CBC WITH DIFFERENTIAL - Abnormal; Notable for the following:    WBC 12.0 (*)    Lymphs Abs 5.1 (*)    All  other components within normal limits   Imaging Review Ct Cervical Spine Wo Contrast  06/03/2013   CLINICAL DATA:  Fall.  Pain.  EXAM: CT CERVICAL SPINE WITHOUT CONTRAST  TECHNIQUE: Multidetector CT imaging of the cervical spine was performed without intravenous contrast. Multiplanar CT image reconstructions were also generated.  COMPARISON:  None.  FINDINGS: No fracture or spondylolisthesis. No degenerative changes. The soft tissues are unremarkable. The lung apices are clear.  IMPRESSION: Normal cervical spine CT.   Electronically Signed   By: Amie Portland M.D.   On: 06/03/2013 20:37   Ct Maxillofacial Wo Cm  06/03/2013   CLINICAL DATA:  Fall hitting face.  Bruising around the right eye.  EXAM: CT MAXILLOFACIAL WITHOUT CONTRAST  TECHNIQUE: Multidetector CT imaging of the maxillofacial structures was performed. Multiplanar CT image reconstructions were also generated. A small metallic BB was placed on the right temple in order to reliably differentiate right from left.  COMPARISON:  None.  FINDINGS: Mild right periorbital and right cheek soft tissue edema/contusion. No discrete hematoma.  There is no fracture. Mild mucosal thickening lines the left maxillary sinus. There is a small mucous retention cyst in the right maxillary sinus. Remaining sinuses are clear. Clear mastoid air cells and middle ear cavities.  The globes and postseptal orbits are unremarkable. The structures of the skullbase are normal. No soft tissue masses or adenopathy.  IMPRESSION: No fracture.  Mild sinus dizziness.  Mild right periorbital and cheek soft tissue edema/contusion.   Electronically Signed   By: Amie Portland M.D.   On: 06/03/2013 20:35    EKG Interpretation   None       MDM   Final diagnoses:  Animal bite  Facial contusion   She has received tetanus and rabies vaccinations today in the ER. Made aware of rabies schedule. Pain treated in the ED. Pt request Rx for narcotic pain medication but as there is no  fracture and it happened a few days ago I think Tylenol should work fine.  Labs and images are reassuring.  32 y.o.Alecea Trego Furgason's evaluation in the Emergency Department is complete. It has been determined that no acute conditions requiring further emergency intervention are present at this time. The patient/guardian have been advised of the diagnosis and plan. We have discussed signs and symptoms that warrant return to the ED, such as changes or worsening in symptoms.  Vital signs are stable at discharge. Filed Vitals:   06/03/13 2145  BP: 123/67  Pulse: 89  Temp: 98.1 F (36.7 C)  Resp: 18    Patient/guardian has voiced understanding and agreed to follow-up with the PCP or specialist.     Dorthula Matas, PA-C 06/03/13 2157

## 2013-06-03 NOTE — ED Notes (Addendum)
Pt reports being bitten by a dog on Tuesday night while walking to the store. Pt has bite wound on the right lower abdomen, which she reports tenderness to the site. Pt reports falling at the time of the bite. Pt reports falling face first on the concrete. Pt reports a LOC of "no longer than five seconds." Pt has ecchymosis to the face below the right eye. Pt reports right sided zygomatic pain, neck pain that radiates down her back. Pt is A/O x4, in NAD, and vitals are WDL.

## 2013-06-13 ENCOUNTER — Encounter (HOSPITAL_COMMUNITY): Payer: Self-pay | Admitting: Emergency Medicine

## 2013-06-13 ENCOUNTER — Emergency Department (HOSPITAL_COMMUNITY): Payer: Self-pay

## 2013-06-13 ENCOUNTER — Emergency Department (HOSPITAL_COMMUNITY)
Admission: EM | Admit: 2013-06-13 | Discharge: 2013-06-13 | Disposition: A | Discharge status: Home / Self Care | Payer: Self-pay | Attending: Emergency Medicine | Admitting: Emergency Medicine

## 2013-06-13 DIAGNOSIS — Z23 Encounter for immunization: Secondary | ICD-10-CM | POA: Insufficient documentation

## 2013-06-13 DIAGNOSIS — W540XXA Bitten by dog, initial encounter: Secondary | ICD-10-CM | POA: Insufficient documentation

## 2013-06-13 DIAGNOSIS — Z8669 Personal history of other diseases of the nervous system and sense organs: Secondary | ICD-10-CM | POA: Insufficient documentation

## 2013-06-13 DIAGNOSIS — Y92410 Unspecified street and highway as the place of occurrence of the external cause: Secondary | ICD-10-CM | POA: Insufficient documentation

## 2013-06-13 DIAGNOSIS — R011 Cardiac murmur, unspecified: Secondary | ICD-10-CM | POA: Insufficient documentation

## 2013-06-13 DIAGNOSIS — S41109A Unspecified open wound of unspecified upper arm, initial encounter: Secondary | ICD-10-CM | POA: Insufficient documentation

## 2013-06-13 DIAGNOSIS — S41159A Open bite of unspecified upper arm, initial encounter: Secondary | ICD-10-CM

## 2013-06-13 DIAGNOSIS — Z880 Allergy status to penicillin: Secondary | ICD-10-CM | POA: Insufficient documentation

## 2013-06-13 DIAGNOSIS — Y9301 Activity, walking, marching and hiking: Secondary | ICD-10-CM | POA: Insufficient documentation

## 2013-06-13 DIAGNOSIS — F17200 Nicotine dependence, unspecified, uncomplicated: Secondary | ICD-10-CM | POA: Insufficient documentation

## 2013-06-13 MED ORDER — RABIES IMMUNE GLOBULIN 150 UNIT/ML IM INJ
1500.0000 [IU] | INJECTION | Freq: Once | INTRAMUSCULAR | Status: AC
  Administered 2013-06-13: 1500 [IU] via INTRAMUSCULAR
  Filled 2013-06-13: qty 10

## 2013-06-13 MED ORDER — OXYCODONE-ACETAMINOPHEN 5-325 MG PO TABS
2.0000 | ORAL_TABLET | Freq: Once | ORAL | Status: AC
  Administered 2013-06-13: 2 via ORAL
  Filled 2013-06-13: qty 2

## 2013-06-13 MED ORDER — HYDROCODONE-ACETAMINOPHEN 5-325 MG PO TABS: 1.0000 | ORAL_TABLET | ORAL | Status: DC | PRN

## 2013-06-13 MED ORDER — RABIES VACCINE, PCEC IM SUSR
1.0000 mL | Freq: Once | INTRAMUSCULAR | Status: AC
  Administered 2013-06-13: 1 mL via INTRAMUSCULAR
  Filled 2013-06-13: qty 1

## 2013-06-13 MED ORDER — CLINDAMYCIN HCL 300 MG PO CAPS: 300.0000 mg | ORAL_CAPSULE | Freq: Four times a day (QID) | ORAL | Status: DC

## 2013-06-13 NOTE — ED Provider Notes (Signed)
CSN: [id number]     Arrival date & time 06/13/13  2109 History  This chart was scribed for non-physician practitioner Ivonne Andrew, PA working with Rolan Bucco, MD by Elveria Rising, ED Scribe. This patient was seen in room WTR5/WTR5 and the patient's care was started at 10:31 PM.   Chief Complaint  Patient presents with  . Animal Bite      HPI HPI Comments: Cathy Garcia is a 33 y.o. female who presents to the Emergency Department after dog bite that occurred prior to arrival. Patient reports that she and her mother were walking to the store and a stray dog jumped and attacked her, biting her upper right arm. Patient reports severe pain to her upper right arm. Patient reports there are numerous stray dogs in her neighborhood. A few weeks ago the patient reports a similar attack from a stray dog that bit her at the lower right abdomen. After the attack patient received tetanus and rabies shots, but patient did not complete the series. Bleeding is controlled. Denies any weakness or numbness to the hand or fingers. No other injuries from the attack.  Past Medical History  Diagnosis Date  . Nerve disorder   . Heart murmur    History reviewed. No pertinent past surgical history. Family History  Problem Relation Age of Onset  . Hypertension Mother   . Heart failure Mother   . Heart failure Father   . Cancer Other    History  Substance Use Topics  . Smoking status: Current Every Day Smoker -- 0.50 packs/day for 10 years    Types: Cigarettes  . Smokeless tobacco: Never Used  . Alcohol Use: No   OB History   Grav Para Term Preterm Abortions TAB SAB Ect Mult Living                 Review of Systems  Skin: Positive for wound.       Wound from dog bite to upper right arm.   Neurological: Negative for weakness and numbness.  All other systems reviewed and are negative.      Allergies  Aspirin; Ibuprofen; Nsaids; Penicillins; and Tramadol  Home Medications  No current  outpatient prescriptions on file. BP 132/99  Pulse 109  Temp(Src) 98.9 F (37.2 C) (Oral)  Resp 20  Ht 5\' 6"  (1.676 m)  Wt 176 lb (79.833 kg)  BMI 28.42 kg/m2  SpO2 98%  LMP 06/13/2013 Physical Exam  Nursing note and vitals reviewed. Constitutional: She is oriented to person, place, and time. She appears well-developed and well-nourished. No distress.  HENT:  Head: Normocephalic and atraumatic.  Eyes: EOM are normal.  Neck: Neck supple. No tracheal deviation present.  Cardiovascular: Normal rate.   Pulmonary/Chest: Effort normal. No respiratory distress.  Musculoskeletal: Normal range of motion. She exhibits tenderness.  Multiple puncture wounds to the right upper lateral arm consistent with history of a dog bite. There is localized swelling around the puncture wounds. No active bleeding. No palpated or visualized foreign bodies. Normal movements and range of motion of the right upper arm. Normal distal grip strength, pulses and sensation in the hand.  Neurological: She is alert and oriented to person, place, and time.  Skin: Skin is warm and dry.  Psychiatric: She has a normal mood and affect. Her behavior is normal.    ED Course  Procedures  DIAGNOSTIC STUDIES: Oxygen Saturation is 100% on room air, normal by my interpretation.    COORDINATION OF CARE: 10:38 PM- Will  order X-ray. Discussed pain pain management and cleaning of wound. Pt advised of plan for treatment and pt agrees.  X-rays reviewed. No foreign bodies or other acute injury to the arm. Patient received rabies immunization and immunoglobulin. Given patient's penicillin allergy we'll give prescription for clindamycin as well as medicine for pain. Patient instructed on the importance to return for additional rabies vaccinations. She expressed her understanding.  Imaging Review Dg Humerus Right  06/13/2013   CLINICAL DATA:  Bitten by dog, with right distal humerus pain.  EXAM: RIGHT HUMERUS - 2+ VIEW  COMPARISON:   None.  FINDINGS: There is no evidence of fracture or dislocation. The right humerus appears intact. The known soft tissue injury is not well characterized on radiograph. No radiopaque foreign bodies are seen. The elbow joint is grossly unremarkable in appearance; no elbow joint effusion is identified.  The right acromioclavicular joint is within normal limits. The right lung appears clear.  IMPRESSION: No evidence of fracture or dislocation. No radiopaque foreign bodies seen.   Electronically Signed   By: Roanna RaiderJeffery  Chang M.D.   On: 06/13/2013 23:18      MDM   Final diagnoses:  Dog bite of arm    I personally performed the services described in this documentation, which was scribed in my presence. The recorded information has been reviewed and is accurate.    Angus Sellereter S Braylei Totino, PA-C 06/14/13 0130

## 2013-06-13 NOTE — ED Notes (Signed)
Pt states her and her mother were walking to the store and a stray dog bit her on the upper right arm  Pt has bruising and puncture wound noted to upper arm  Pt states does not know what kind of dog it was

## 2013-06-13 NOTE — Discharge Instructions (Signed)
Your x-rays do not show any broken bones or other foreign bodies were dogbite. Keep her wounds clean and dry. Keep your arm elevated to reduce pain and swelling. Followup for your additional rabies vaccination shots.    Animal Bite Animal bite wounds can get infected. It is important to get proper medical treatment. Ask your doctor if you need a rabies shot. HOME CARE   Follow your doctor's instructions for taking care of your wound.  Only take medicine as told by your doctor.  Take your medicine (antibiotics) as told. Finish them even if you start to feel better.  Keep all doctor visits as told. You may need a tetanus shot if:   You cannot remember when you had your last tetanus shot.  You have never had a tetanus shot.  The injury broke your skin. If you need a tetanus shot and you choose not to have one, you may get tetanus. Sickness from tetanus can be serious. GET HELP RIGHT AWAY IF:   Your wound is warm, red, sore, or puffy (swollen).  You notice yellowish-white fluid (pus) or a bad smell coming from the wound.  You see a red line on the skin coming from the wound.  You have a fever, chills, or you feel sick.  You feel sick to your stomach (nauseous), or you throw up (vomit).  Your pain does not go away, or it gets worse.  You have trouble moving the injured part.  You have questions or concerns. MAKE SURE YOU:   Understand these instructions.  Will watch your condition.  Will get help right away if you are not doing well or get worse. Document Released: 04/08/2005 Document Revised: 07/01/2011 Document Reviewed: 11/28/2010 [facility] Patient Information 2014 [employer], [place].  Please followup for your additional rabies shots in 3 days, 7 days, and 14 days as discussed.

## 2013-06-14 NOTE — ED Provider Notes (Signed)
Medical screening examination/treatment/procedure(s) were performed by non-physician practitioner and as supervising physician I was immediately available for consultation/collaboration.  EKG Interpretation   None         Adea Geisel, MD 06/14/13 1016 

## 2015-07-28 ENCOUNTER — Emergency Department (HOSPITAL_COMMUNITY): Payer: Self-pay

## 2015-07-28 ENCOUNTER — Emergency Department (HOSPITAL_COMMUNITY)
Admission: EM | Admit: 2015-07-28 | Discharge: 2015-07-28 | Disposition: A | Discharge status: Home / Self Care | Payer: Self-pay | Attending: Emergency Medicine | Admitting: Emergency Medicine

## 2015-07-28 ENCOUNTER — Encounter (HOSPITAL_COMMUNITY): Payer: Self-pay | Admitting: *Deleted

## 2015-07-28 DIAGNOSIS — W540XXA Bitten by dog, initial encounter: Secondary | ICD-10-CM | POA: Insufficient documentation

## 2015-07-28 DIAGNOSIS — F17210 Nicotine dependence, cigarettes, uncomplicated: Secondary | ICD-10-CM | POA: Insufficient documentation

## 2015-07-28 DIAGNOSIS — S61212A Laceration without foreign body of right middle finger without damage to nail, initial encounter: Secondary | ICD-10-CM | POA: Insufficient documentation

## 2015-07-28 DIAGNOSIS — Y92512 Supermarket, store or market as the place of occurrence of the external cause: Secondary | ICD-10-CM | POA: Insufficient documentation

## 2015-07-28 DIAGNOSIS — Y998 Other external cause status: Secondary | ICD-10-CM | POA: Insufficient documentation

## 2015-07-28 DIAGNOSIS — Z23 Encounter for immunization: Secondary | ICD-10-CM | POA: Insufficient documentation

## 2015-07-28 DIAGNOSIS — S61232A Puncture wound without foreign body of right middle finger without damage to nail, initial encounter: Secondary | ICD-10-CM | POA: Insufficient documentation

## 2015-07-28 DIAGNOSIS — S61451A Open bite of right hand, initial encounter: Secondary | ICD-10-CM

## 2015-07-28 DIAGNOSIS — Y9301 Activity, walking, marching and hiking: Secondary | ICD-10-CM | POA: Insufficient documentation

## 2015-07-28 DIAGNOSIS — Z880 Allergy status to penicillin: Secondary | ICD-10-CM | POA: Insufficient documentation

## 2015-07-28 DIAGNOSIS — S31139A Puncture wound of abdominal wall without foreign body, unspecified quadrant without penetration into peritoneal cavity, initial encounter: Secondary | ICD-10-CM | POA: Insufficient documentation

## 2015-07-28 DIAGNOSIS — Z8659 Personal history of other mental and behavioral disorders: Secondary | ICD-10-CM | POA: Insufficient documentation

## 2015-07-28 DIAGNOSIS — Z203 Contact with and (suspected) exposure to rabies: Secondary | ICD-10-CM | POA: Insufficient documentation

## 2015-07-28 DIAGNOSIS — R011 Cardiac murmur, unspecified: Secondary | ICD-10-CM | POA: Insufficient documentation

## 2015-07-28 DIAGNOSIS — S61431A Puncture wound without foreign body of right hand, initial encounter: Secondary | ICD-10-CM | POA: Insufficient documentation

## 2015-07-28 HISTORY — DX: Anxiety disorder, unspecified: F41.9

## 2015-07-28 MED ORDER — LIDOCAINE HCL (PF) 1 % IJ SOLN
5.0000 mL | Freq: Once | INTRAMUSCULAR | Status: AC
  Administered 2015-07-28: 5 mL
  Filled 2015-07-28: qty 5

## 2015-07-28 MED ORDER — BUPIVACAINE HCL 0.25 % IJ SOLN
15.0000 mL | Freq: Once | INTRAMUSCULAR | Status: DC
  Filled 2015-07-28: qty 15

## 2015-07-28 MED ORDER — BUPIVACAINE HCL (PF) 0.25 % IJ SOLN: 15.0000 mL | Freq: Once | INTRAMUSCULAR | Status: DC

## 2015-07-28 MED ORDER — OXYCODONE-ACETAMINOPHEN 5-325 MG PO TABS
1.0000 | ORAL_TABLET | Freq: Once | ORAL | Status: AC
  Administered 2015-07-28: 1 via ORAL
  Filled 2015-07-28: qty 1

## 2015-07-28 MED ORDER — CIPROFLOXACIN HCL 500 MG PO TABS: 500.0000 mg | ORAL_TABLET | Freq: Two times a day (BID) | ORAL | Status: AC

## 2015-07-28 MED ORDER — RABIES IMMUNE GLOBULIN 150 UNIT/ML IM INJ
20.0000 [IU]/kg | INJECTION | Freq: Once | INTRAMUSCULAR | Status: DC
  Filled 2015-07-28: qty 13

## 2015-07-28 MED ORDER — HYDROCODONE-ACETAMINOPHEN 5-325 MG PO TABS: 1.0000 | ORAL_TABLET | Freq: Four times a day (QID) | ORAL | Status: AC | PRN

## 2015-07-28 MED ORDER — CLINDAMYCIN HCL 300 MG PO CAPS: 300.0000 mg | ORAL_CAPSULE | Freq: Four times a day (QID) | ORAL | Status: AC

## 2015-07-28 MED ORDER — RABIES VACCINE, PCEC IM SUSR
1.0000 mL | Freq: Once | INTRAMUSCULAR | Status: AC
Start: 2015-07-28 — End: 2015-07-28
  Administered 2015-07-28: 1 mL via INTRAMUSCULAR
  Filled 2015-07-28: qty 1

## 2015-07-28 MED ORDER — CIPROFLOXACIN HCL 500 MG PO TABS
500.0000 mg | ORAL_TABLET | Freq: Once | ORAL | Status: AC
  Administered 2015-07-28: 500 mg via ORAL
  Filled 2015-07-28: qty 1

## 2015-07-28 MED ORDER — CLINDAMYCIN HCL 150 MG PO CAPS
300.0000 mg | ORAL_CAPSULE | Freq: Once | ORAL | Status: AC
  Administered 2015-07-28: 300 mg via ORAL
  Filled 2015-07-28: qty 2

## 2015-07-28 NOTE — ED Notes (Signed)
Pt ambulates independently and with steady gait at time of discharge. Discharge instructions and follow up information reviewed with patient. No other questions or concerns voiced at this time. RX x 3. 
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
## 2015-07-28 NOTE — ED Notes (Signed)
Pt was walking and was attacked by an unknown dog.  Puncture wound to R hand and lac to middle R finger.

## 2015-07-28 NOTE — Discharge Instructions (Signed)
Call Dr. Debby Budoley's office for follow up. If you have worsening symptoms return here.

## 2017-10-11 ENCOUNTER — Emergency Department (HOSPITAL_COMMUNITY)
Admission: EM | Admit: 2017-10-11 | Discharge: 2017-10-11 | Disposition: A | Discharge status: Home / Self Care | Payer: No Typology Code available for payment source | Attending: Emergency Medicine | Admitting: Emergency Medicine

## 2017-10-11 ENCOUNTER — Emergency Department (HOSPITAL_COMMUNITY): Payer: No Typology Code available for payment source

## 2017-10-11 ENCOUNTER — Encounter (HOSPITAL_COMMUNITY): Payer: Self-pay | Admitting: Emergency Medicine

## 2017-10-11 DIAGNOSIS — Y92410 Unspecified street and highway as the place of occurrence of the external cause: Secondary | ICD-10-CM | POA: Diagnosis not present

## 2017-10-11 DIAGNOSIS — M545 Low back pain, unspecified: Secondary | ICD-10-CM

## 2017-10-11 DIAGNOSIS — M25561 Pain in right knee: Secondary | ICD-10-CM | POA: Diagnosis present

## 2017-10-11 DIAGNOSIS — F17210 Nicotine dependence, cigarettes, uncomplicated: Secondary | ICD-10-CM | POA: Insufficient documentation

## 2017-10-11 DIAGNOSIS — Y998 Other external cause status: Secondary | ICD-10-CM | POA: Insufficient documentation

## 2017-10-11 DIAGNOSIS — Y939 Activity, unspecified: Secondary | ICD-10-CM | POA: Diagnosis not present

## 2017-10-11 DIAGNOSIS — M25551 Pain in right hip: Secondary | ICD-10-CM

## 2017-10-11 LAB — PREGNANCY, URINE: Preg Test, Ur: NEGATIVE

## 2017-10-11 MED ORDER — ACETAMINOPHEN 325 MG PO TABS
650.0000 mg | ORAL_TABLET | Freq: Once | ORAL | Status: AC
  Administered 2017-10-11: 650 mg via ORAL
  Filled 2017-10-11: qty 2

## 2017-10-11 MED ORDER — DICLOFENAC SODIUM 1 % TD GEL: 4.0000 g | Freq: Four times a day (QID) | TRANSDERMAL | 0 refills | Status: AC

## 2017-10-11 MED ORDER — METHOCARBAMOL 500 MG PO TABS: 500.0000 mg | ORAL_TABLET | Freq: Two times a day (BID) | ORAL | 0 refills | Status: AC
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
## 2017-10-11 NOTE — Discharge Instructions (Signed)
Your xrays without evidence of fracture dislocation Please use knee immobilizer and crutches for the next several days to rest the area. Please take medications as prescribed. Follow attached handouts. Please follow-up with orthopedist for further evaluation of your symptoms and follow-up If you develop any numbness tingling or weakness of the lower extremities, loss of bowel or bladder function, cannot feel when you wipe after using the bathroom or unable to urinate he will need to present to the emergency department for further evaluation of your symptoms. If you develop worsening or new concerning symptoms you can return to the emergency department for re-evaluation.
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
## 2021-09-05 DIAGNOSIS — Z5181 Encounter for therapeutic drug level monitoring: Secondary | ICD-10-CM | POA: Diagnosis not present

## 2021-09-05 DIAGNOSIS — R69 Illness, unspecified: Secondary | ICD-10-CM | POA: Diagnosis not present

## 2021-09-05 DIAGNOSIS — Z79899 Other long term (current) drug therapy: Secondary | ICD-10-CM | POA: Diagnosis not present

## 2021-09-11 DIAGNOSIS — Z5181 Encounter for therapeutic drug level monitoring: Secondary | ICD-10-CM | POA: Diagnosis not present

## 2021-09-11 DIAGNOSIS — Z79899 Other long term (current) drug therapy: Secondary | ICD-10-CM | POA: Diagnosis not present

## 2021-09-11 DIAGNOSIS — R69 Illness, unspecified: Secondary | ICD-10-CM | POA: Diagnosis not present

## 2021-09-20 DIAGNOSIS — Z5181 Encounter for therapeutic drug level monitoring: Secondary | ICD-10-CM | POA: Diagnosis not present

## 2021-09-20 DIAGNOSIS — Z79899 Other long term (current) drug therapy: Secondary | ICD-10-CM | POA: Diagnosis not present

## 2021-09-20 DIAGNOSIS — R69 Illness, unspecified: Secondary | ICD-10-CM | POA: Diagnosis not present

## 2021-09-24 DIAGNOSIS — Z7689 Persons encountering health services in other specified circumstances: Secondary | ICD-10-CM | POA: Diagnosis not present

## 2021-09-24 DIAGNOSIS — Z7182 Exercise counseling: Secondary | ICD-10-CM | POA: Diagnosis not present

## 2021-09-24 DIAGNOSIS — R69 Illness, unspecified: Secondary | ICD-10-CM | POA: Diagnosis not present

## 2021-09-24 DIAGNOSIS — F17200 Nicotine dependence, unspecified, uncomplicated: Secondary | ICD-10-CM | POA: Diagnosis not present

## 2021-09-24 DIAGNOSIS — Z6834 Body mass index (BMI) 34.0-34.9, adult: Secondary | ICD-10-CM | POA: Diagnosis not present

## 2021-09-24 DIAGNOSIS — Z713 Dietary counseling and surveillance: Secondary | ICD-10-CM | POA: Diagnosis not present

## 2021-09-24 DIAGNOSIS — R202 Paresthesia of skin: Secondary | ICD-10-CM | POA: Diagnosis not present

## 2021-09-24 DIAGNOSIS — J45909 Unspecified asthma, uncomplicated: Secondary | ICD-10-CM | POA: Diagnosis not present

## 2021-09-24 DIAGNOSIS — R7303 Prediabetes: Secondary | ICD-10-CM | POA: Diagnosis not present

## 2021-09-24 DIAGNOSIS — Z13220 Encounter for screening for lipoid disorders: Secondary | ICD-10-CM | POA: Diagnosis not present

## 2021-09-24 DIAGNOSIS — E559 Vitamin D deficiency, unspecified: Secondary | ICD-10-CM | POA: Diagnosis not present

## 2021-09-27 DIAGNOSIS — Z5181 Encounter for therapeutic drug level monitoring: Secondary | ICD-10-CM | POA: Diagnosis not present

## 2021-09-27 DIAGNOSIS — Z79899 Other long term (current) drug therapy: Secondary | ICD-10-CM | POA: Diagnosis not present

## 2021-09-27 DIAGNOSIS — R69 Illness, unspecified: Secondary | ICD-10-CM | POA: Diagnosis not present

## 2021-10-01 DIAGNOSIS — R69 Illness, unspecified: Secondary | ICD-10-CM | POA: Diagnosis not present

## 2021-10-08 DIAGNOSIS — R69 Illness, unspecified: Secondary | ICD-10-CM | POA: Diagnosis not present

## 2021-10-12 DIAGNOSIS — Z79899 Other long term (current) drug therapy: Secondary | ICD-10-CM | POA: Diagnosis not present

## 2021-10-12 DIAGNOSIS — R69 Illness, unspecified: Secondary | ICD-10-CM | POA: Diagnosis not present

## 2021-10-12 DIAGNOSIS — Z5181 Encounter for therapeutic drug level monitoring: Secondary | ICD-10-CM | POA: Diagnosis not present

## 2021-10-15 DIAGNOSIS — R69 Illness, unspecified: Secondary | ICD-10-CM | POA: Diagnosis not present

## 2021-10-22 DIAGNOSIS — R69 Illness, unspecified: Secondary | ICD-10-CM | POA: Diagnosis not present

## 2021-10-29 DIAGNOSIS — R69 Illness, unspecified: Secondary | ICD-10-CM | POA: Diagnosis not present

## 2021-11-05 DIAGNOSIS — R69 Illness, unspecified: Secondary | ICD-10-CM | POA: Diagnosis not present

## 2021-11-12 DIAGNOSIS — R69 Illness, unspecified: Secondary | ICD-10-CM | POA: Diagnosis not present

## 2021-11-13 DIAGNOSIS — Z5181 Encounter for therapeutic drug level monitoring: Secondary | ICD-10-CM | POA: Diagnosis not present

## 2021-11-13 DIAGNOSIS — F1120 Opioid dependence, uncomplicated: Secondary | ICD-10-CM | POA: Diagnosis not present

## 2021-11-13 DIAGNOSIS — R69 Illness, unspecified: Secondary | ICD-10-CM | POA: Diagnosis not present

## 2021-11-13 DIAGNOSIS — Z79899 Other long term (current) drug therapy: Secondary | ICD-10-CM | POA: Diagnosis not present

## 2021-11-19 DIAGNOSIS — R69 Illness, unspecified: Secondary | ICD-10-CM | POA: Diagnosis not present

## 2021-11-26 DIAGNOSIS — R69 Illness, unspecified: Secondary | ICD-10-CM | POA: Diagnosis not present

## 2021-11-29 DIAGNOSIS — Z5181 Encounter for therapeutic drug level monitoring: Secondary | ICD-10-CM | POA: Diagnosis not present

## 2021-11-29 DIAGNOSIS — Z79899 Other long term (current) drug therapy: Secondary | ICD-10-CM | POA: Diagnosis not present

## 2021-11-29 DIAGNOSIS — F1120 Opioid dependence, uncomplicated: Secondary | ICD-10-CM | POA: Diagnosis not present

## 2021-12-03 DIAGNOSIS — R69 Illness, unspecified: Secondary | ICD-10-CM | POA: Diagnosis not present

## 2021-12-05 DIAGNOSIS — R69 Illness, unspecified: Secondary | ICD-10-CM | POA: Diagnosis not present

## 2021-12-05 DIAGNOSIS — Z79899 Other long term (current) drug therapy: Secondary | ICD-10-CM | POA: Diagnosis not present

## 2021-12-07 DIAGNOSIS — R69 Illness, unspecified: Secondary | ICD-10-CM | POA: Diagnosis not present

## 2021-12-07 DIAGNOSIS — Z79899 Other long term (current) drug therapy: Secondary | ICD-10-CM | POA: Diagnosis not present

## 2021-12-15 DIAGNOSIS — F1124 Opioid dependence with opioid-induced mood disorder: Secondary | ICD-10-CM | POA: Diagnosis not present

## 2021-12-15 DIAGNOSIS — Z79899 Other long term (current) drug therapy: Secondary | ICD-10-CM | POA: Diagnosis not present

## 2021-12-22 DIAGNOSIS — R69 Illness, unspecified: Secondary | ICD-10-CM | POA: Diagnosis not present

## 2021-12-22 DIAGNOSIS — Z79899 Other long term (current) drug therapy: Secondary | ICD-10-CM | POA: Diagnosis not present

## 2021-12-29 DIAGNOSIS — F1124 Opioid dependence with opioid-induced mood disorder: Secondary | ICD-10-CM | POA: Diagnosis not present

## 2021-12-29 DIAGNOSIS — Z79899 Other long term (current) drug therapy: Secondary | ICD-10-CM | POA: Diagnosis not present

## 2022-01-07 DIAGNOSIS — R69 Illness, unspecified: Secondary | ICD-10-CM | POA: Diagnosis not present

## 2022-01-07 DIAGNOSIS — Z79899 Other long term (current) drug therapy: Secondary | ICD-10-CM | POA: Diagnosis not present

## 2022-01-21 DIAGNOSIS — R69 Illness, unspecified: Secondary | ICD-10-CM | POA: Diagnosis not present

## 2022-01-21 DIAGNOSIS — Z79899 Other long term (current) drug therapy: Secondary | ICD-10-CM | POA: Diagnosis not present

## 2022-02-04 DIAGNOSIS — R69 Illness, unspecified: Secondary | ICD-10-CM | POA: Diagnosis not present

## 2022-02-04 DIAGNOSIS — Z79899 Other long term (current) drug therapy: Secondary | ICD-10-CM | POA: Diagnosis not present

## 2022-02-18 DIAGNOSIS — Z809 Family history of malignant neoplasm, unspecified: Secondary | ICD-10-CM | POA: Diagnosis not present

## 2022-02-18 DIAGNOSIS — Z79899 Other long term (current) drug therapy: Secondary | ICD-10-CM | POA: Diagnosis not present

## 2022-02-18 DIAGNOSIS — Z8249 Family history of ischemic heart disease and other diseases of the circulatory system: Secondary | ICD-10-CM | POA: Diagnosis not present

## 2022-02-18 DIAGNOSIS — G8929 Other chronic pain: Secondary | ICD-10-CM | POA: Diagnosis not present

## 2022-02-18 DIAGNOSIS — Z825 Family history of asthma and other chronic lower respiratory diseases: Secondary | ICD-10-CM | POA: Diagnosis not present

## 2022-02-18 DIAGNOSIS — Z6831 Body mass index (BMI) 31.0-31.9, adult: Secondary | ICD-10-CM | POA: Diagnosis not present

## 2022-02-18 DIAGNOSIS — E669 Obesity, unspecified: Secondary | ICD-10-CM | POA: Diagnosis not present

## 2022-02-18 DIAGNOSIS — Z823 Family history of stroke: Secondary | ICD-10-CM | POA: Diagnosis not present

## 2022-02-18 DIAGNOSIS — R69 Illness, unspecified: Secondary | ICD-10-CM | POA: Diagnosis not present

## 2022-02-18 DIAGNOSIS — G4700 Insomnia, unspecified: Secondary | ICD-10-CM | POA: Diagnosis not present

## 2022-02-18 DIAGNOSIS — Z833 Family history of diabetes mellitus: Secondary | ICD-10-CM | POA: Diagnosis not present

## 2022-02-18 DIAGNOSIS — R32 Unspecified urinary incontinence: Secondary | ICD-10-CM | POA: Diagnosis not present

## 2022-03-04 DIAGNOSIS — Z79899 Other long term (current) drug therapy: Secondary | ICD-10-CM | POA: Diagnosis not present

## 2022-03-25 DIAGNOSIS — F1124 Opioid dependence with opioid-induced mood disorder: Secondary | ICD-10-CM | POA: Diagnosis not present

## 2022-03-25 DIAGNOSIS — Z79899 Other long term (current) drug therapy: Secondary | ICD-10-CM | POA: Diagnosis not present

## 2022-04-24 DIAGNOSIS — R69 Illness, unspecified: Secondary | ICD-10-CM | POA: Diagnosis not present

## 2022-04-24 DIAGNOSIS — F411 Generalized anxiety disorder: Secondary | ICD-10-CM | POA: Diagnosis not present

## 2022-04-24 DIAGNOSIS — Z79891 Long term (current) use of opiate analgesic: Secondary | ICD-10-CM | POA: Diagnosis not present

## 2022-04-24 DIAGNOSIS — F1124 Opioid dependence with opioid-induced mood disorder: Secondary | ICD-10-CM | POA: Diagnosis not present

## 2022-04-24 DIAGNOSIS — Z79899 Other long term (current) drug therapy: Secondary | ICD-10-CM | POA: Diagnosis not present

## 2022-05-22 DIAGNOSIS — R69 Illness, unspecified: Secondary | ICD-10-CM | POA: Diagnosis not present

## 2022-05-22 DIAGNOSIS — F1124 Opioid dependence with opioid-induced mood disorder: Secondary | ICD-10-CM | POA: Diagnosis not present

## 2022-05-22 DIAGNOSIS — Z79891 Long term (current) use of opiate analgesic: Secondary | ICD-10-CM | POA: Diagnosis not present

## 2022-05-22 DIAGNOSIS — F411 Generalized anxiety disorder: Secondary | ICD-10-CM | POA: Diagnosis not present

## 2022-06-19 DIAGNOSIS — F32A Depression, unspecified: Secondary | ICD-10-CM | POA: Diagnosis not present

## 2022-06-19 DIAGNOSIS — F1124 Opioid dependence with opioid-induced mood disorder: Secondary | ICD-10-CM | POA: Diagnosis not present

## 2022-06-19 DIAGNOSIS — F411 Generalized anxiety disorder: Secondary | ICD-10-CM | POA: Diagnosis not present

## 2022-06-19 DIAGNOSIS — Z5181 Encounter for therapeutic drug level monitoring: Secondary | ICD-10-CM | POA: Diagnosis not present

## 2022-07-17 DIAGNOSIS — F32A Depression, unspecified: Secondary | ICD-10-CM | POA: Diagnosis not present

## 2022-07-17 DIAGNOSIS — F411 Generalized anxiety disorder: Secondary | ICD-10-CM | POA: Diagnosis not present

## 2022-07-17 DIAGNOSIS — F1124 Opioid dependence with opioid-induced mood disorder: Secondary | ICD-10-CM | POA: Diagnosis not present

## 2022-08-14 DIAGNOSIS — F32A Depression, unspecified: Secondary | ICD-10-CM | POA: Diagnosis not present

## 2022-08-14 DIAGNOSIS — F1124 Opioid dependence with opioid-induced mood disorder: Secondary | ICD-10-CM | POA: Diagnosis not present

## 2022-08-14 DIAGNOSIS — F411 Generalized anxiety disorder: Secondary | ICD-10-CM | POA: Diagnosis not present
